# Patient Record
Sex: Female | Born: 1965
Health system: Southern US, Community
[De-identification: ages and names within clinical notes are randomized; demographics above are authoritative.]

## PROBLEM LIST (undated history)

## (undated) DIAGNOSIS — E559 Vitamin D deficiency, unspecified: Secondary | ICD-10-CM

## (undated) DIAGNOSIS — H8110 Benign paroxysmal vertigo, unspecified ear: Secondary | ICD-10-CM

## (undated) DIAGNOSIS — R002 Palpitations: Secondary | ICD-10-CM

## (undated) DIAGNOSIS — I499 Cardiac arrhythmia, unspecified: Secondary | ICD-10-CM

## (undated) DIAGNOSIS — Z9889 Other specified postprocedural states: Secondary | ICD-10-CM

## (undated) DIAGNOSIS — N951 Menopausal and female climacteric states: Secondary | ICD-10-CM

## (undated) DIAGNOSIS — E785 Hyperlipidemia, unspecified: Secondary | ICD-10-CM

## (undated) DIAGNOSIS — K219 Gastro-esophageal reflux disease without esophagitis: Secondary | ICD-10-CM

## (undated) DIAGNOSIS — R112 Nausea with vomiting, unspecified: Secondary | ICD-10-CM

## (undated) DIAGNOSIS — R7303 Prediabetes: Secondary | ICD-10-CM

## (undated) DIAGNOSIS — E0590 Thyrotoxicosis, unspecified without thyrotoxic crisis or storm: Secondary | ICD-10-CM

## (undated) DIAGNOSIS — F419 Anxiety disorder, unspecified: Secondary | ICD-10-CM

## (undated) DIAGNOSIS — F329 Major depressive disorder, single episode, unspecified: Secondary | ICD-10-CM

## (undated) HISTORY — DX: Menopausal and female climacteric states: N95.1

## (undated) HISTORY — DX: Palpitations: R00.2

## (undated) HISTORY — DX: Major depressive disorder, single episode, unspecified: F32.9

## (undated) HISTORY — DX: Other specified postprocedural states: Z98.890

## (undated) HISTORY — DX: Hyperlipidemia, unspecified: E78.5

## (undated) HISTORY — DX: Vitamin D deficiency, unspecified: E55.9

## (undated) HISTORY — DX: Anxiety disorder, unspecified: F41.9

## (undated) HISTORY — DX: Thyrotoxicosis, unspecified without thyrotoxic crisis or storm: E05.90

---

## 2004-06-27 ENCOUNTER — Ambulatory Visit: Payer: Self-pay | Admitting: Unknown Physician Specialty

## 2005-07-30 ENCOUNTER — Ambulatory Visit: Payer: Self-pay | Admitting: Unknown Physician Specialty

## 2006-08-03 ENCOUNTER — Ambulatory Visit: Payer: Self-pay | Admitting: Unknown Physician Specialty

## 2007-08-09 ENCOUNTER — Ambulatory Visit: Payer: Self-pay | Admitting: Unknown Physician Specialty

## 2007-08-25 ENCOUNTER — Ambulatory Visit: Payer: Self-pay | Admitting: Gastroenterology

## 2008-08-09 ENCOUNTER — Ambulatory Visit: Payer: Self-pay | Admitting: Unknown Physician Specialty

## 2009-08-13 ENCOUNTER — Ambulatory Visit: Payer: Self-pay | Admitting: Unknown Physician Specialty

## 2009-08-15 ENCOUNTER — Ambulatory Visit: Payer: Self-pay | Admitting: Unknown Physician Specialty

## 2010-08-21 ENCOUNTER — Ambulatory Visit: Payer: Self-pay | Admitting: Unknown Physician Specialty

## 2012-09-18 HISTORY — PX: COLONOSCOPY: SHX174

## 2012-09-26 ENCOUNTER — Ambulatory Visit: Payer: Self-pay | Admitting: Gastroenterology

## 2012-09-27 LAB — PATHOLOGY REPORT

## 2013-02-27 ENCOUNTER — Ambulatory Visit: Payer: Self-pay | Admitting: Physician Assistant

## 2013-02-27 LAB — RAPID STREP-A WITH REFLX: Micro Text Report: NEGATIVE

## 2013-03-02 LAB — BETA STREP CULTURE(ARMC)

## 2013-04-20 HISTORY — PX: TRIGGER FINGER RELEASE: SHX641

## 2013-09-14 DIAGNOSIS — F32A Depression, unspecified: Secondary | ICD-10-CM

## 2013-09-14 DIAGNOSIS — R946 Abnormal results of thyroid function studies: Secondary | ICD-10-CM

## 2013-09-14 DIAGNOSIS — F419 Anxiety disorder, unspecified: Secondary | ICD-10-CM

## 2013-09-14 DIAGNOSIS — E785 Hyperlipidemia, unspecified: Secondary | ICD-10-CM

## 2013-09-14 DIAGNOSIS — R002 Palpitations: Secondary | ICD-10-CM

## 2013-09-14 DIAGNOSIS — F329 Major depressive disorder, single episode, unspecified: Secondary | ICD-10-CM | POA: Insufficient documentation

## 2013-09-14 DIAGNOSIS — E039 Hypothyroidism, unspecified: Secondary | ICD-10-CM | POA: Insufficient documentation

## 2013-09-14 HISTORY — DX: Palpitations: R00.2

## 2013-09-14 HISTORY — DX: Hyperlipidemia, unspecified: E78.5

## 2013-09-14 HISTORY — DX: Depression, unspecified: F32.A

## 2013-09-14 HISTORY — DX: Anxiety disorder, unspecified: F41.9

## 2013-09-18 ENCOUNTER — Ambulatory Visit: Payer: Self-pay | Admitting: Internal Medicine

## 2013-10-27 DIAGNOSIS — Z9889 Other specified postprocedural states: Secondary | ICD-10-CM | POA: Insufficient documentation

## 2013-10-27 HISTORY — DX: Other specified postprocedural states: Z98.890

## 2013-12-21 ENCOUNTER — Ambulatory Visit: Payer: Self-pay | Admitting: Obstetrics and Gynecology

## 2014-05-24 ENCOUNTER — Ambulatory Visit: Payer: Self-pay | Admitting: Internal Medicine

## 2014-05-24 LAB — RAPID STREP-A WITH REFLX: MICRO TEXT REPORT: NEGATIVE

## 2014-05-27 LAB — BETA STREP CULTURE(ARMC)

## 2016-06-10 DIAGNOSIS — N951 Menopausal and female climacteric states: Secondary | ICD-10-CM | POA: Insufficient documentation

## 2016-06-10 HISTORY — DX: Menopausal and female climacteric states: N95.1

## 2016-06-11 ENCOUNTER — Other Ambulatory Visit: Payer: Self-pay | Admitting: Obstetrics & Gynecology

## 2016-06-11 DIAGNOSIS — Z1231 Encounter for screening mammogram for malignant neoplasm of breast: Secondary | ICD-10-CM

## 2016-06-23 ENCOUNTER — Encounter: Payer: Self-pay | Admitting: Radiology

## 2016-06-23 ENCOUNTER — Ambulatory Visit
Admission: RE | Admit: 2016-06-23 | Discharge: 2016-06-23 | Disposition: A | Discharge status: Home / Self Care | Payer: BLUE CROSS/BLUE SHIELD | Source: Ambulatory Visit | Attending: Obstetrics & Gynecology | Admitting: Obstetrics & Gynecology

## 2016-06-23 DIAGNOSIS — Z1231 Encounter for screening mammogram for malignant neoplasm of breast: Secondary | ICD-10-CM

## 2016-07-30 ENCOUNTER — Ambulatory Visit (INDEPENDENT_AMBULATORY_CARE_PROVIDER_SITE_OTHER): Payer: BLUE CROSS/BLUE SHIELD | Admitting: Gastroenterology

## 2016-07-30 ENCOUNTER — Encounter: Payer: Self-pay | Admitting: Gastroenterology

## 2016-07-30 ENCOUNTER — Other Ambulatory Visit: Payer: Self-pay

## 2016-07-30 VITALS — BP 129/74 | HR 76 | Temp 98.4°F | Ht 64.0 in | Wt 174.0 lb

## 2016-07-30 DIAGNOSIS — R110 Nausea: Secondary | ICD-10-CM

## 2016-07-30 DIAGNOSIS — F32A Depression, unspecified: Secondary | ICD-10-CM | POA: Insufficient documentation

## 2016-07-30 DIAGNOSIS — R194 Change in bowel habit: Secondary | ICD-10-CM

## 2016-07-30 DIAGNOSIS — R197 Diarrhea, unspecified: Secondary | ICD-10-CM

## 2016-07-30 DIAGNOSIS — F329 Major depressive disorder, single episode, unspecified: Secondary | ICD-10-CM | POA: Insufficient documentation

## 2016-07-30 HISTORY — DX: Depression, unspecified: F32.A

## 2016-07-30 NOTE — Progress Notes (Signed)
Gastroenterology Consultation  Referring Provider:     Idelle Crouch, MD Primary Care Physician:  Idelle Crouch, MD Primary Gastroenterologist:  Dr. Allen Norris     Reason for Consultation:     Diarrhea        HPI:   Olivia Armstrong is a 51 y.o. y/o female referred for consultation & management of Diarrhea by Dr. Fulton Reek D, MD.  This patient has seen me in the past for a colonoscopy approximately 4 years ago.  The patient now comes in with 4 months of loose stools.  The patient states her loose stools are usually in the morning and then typically firm up later in the day.  She also reports that she has a lot of bloating but does not report passing any gas.  She states that she also has accompanying nausea when she has this.  There is some intermittent right-sided pain but it is not associated with nausea or fatty greasy foods.  The patient also states that the abdominal pain is rare compared to the diarrhea she has been having for the last 4 months.  She denies any unexplained weight loss with this diarrhea.  She also denies that the diarrhea wakes her up in the middle of the night.  The patient has been reporting that she feels bloated usually after she has a bowel movement.  She denies any episodes similar to this in the past.  Past Medical History:  Diagnosis Date  . Anxiety and depression 09/14/2013  . Depression 07/30/2016  . Hyperlipidemia, unspecified 09/14/2013  . Hyperthyroidism   . Menopausal symptoms 06/10/2016   Overview:  prometrium 200mg  daily estrdiol patch 0.05mg /day q3days  . Palpitations 09/14/2013  . S/P trigger finger release 10/27/2013  . Vitamin D deficiency     No past surgical history on file.  Prior to Admission medications   Medication Sig Start Date End Date Taking? Authorizing Provider  B Complex Vitamins (VITAMIN B COMPLEX Armstrong) Take by mouth.   Yes Historical Provider, MD  escitalopram (LEXAPRO) 10 MG tablet TAKE 1 TABLET (10 MG TOTAL) BY MOUTH ONCE  DAILY. 09/02/15  Yes Historical Provider, MD  estradiol (VIVELLE-DOT) 0.05 MG/24HR patch APPLY 1 PATCH ONTO SKIN TWICE A WEEK 03/20/16  Yes Historical Provider, MD  Multiple Vitamin (MULTI-VITAMINS) TABS Take by mouth.   Yes Historical Provider, MD  Probiotic Product ([employer] PROBIOTIC MAXIMUM STRENGTH) CAPS Take by mouth.   Yes Historical Provider, MD  progesterone (PROMETRIUM) 200 MG capsule TAKE 1 CAPSULE(S) EVERY DAY BY ORAL ROUTE FOR 90 DAYS. 03/20/16  Yes Historical Provider, MD  thyroid (ARMOUR THYROID) 30 MG tablet Take by mouth. 10/04/15  Yes Historical Provider, MD  Vitamin D, Ergocalciferol, (DRISDOL) 50000 units CAPS capsule Take by mouth. 05/18/16  Yes Historical Provider, MD  zolpidem (AMBIEN) 5 MG tablet Take by mouth. 05/21/16  Yes Historical Provider, MD    Family History  Problem Relation Age of Onset  . Breast cancer Maternal Grandmother 90    mat great gm  . Colon cancer Sister      Social History  Substance Use Topics  . Smoking status: Never Smoker  . Smokeless tobacco: Never Used  . Alcohol use Yes    Allergies as of 07/30/2016  . (No Known Allergies)    Review of Systems:    All systems reviewed and negative except where noted in HPI.   Physical Exam:  BP 129/74   Pulse 76   Temp 98.4 F (36.9 C) (Oral)  Ht 5\' 4"  (1.626 m)   Wt 174 lb (78.9 kg)   BMI 29.87 kg/m  No LMP recorded. Patient is postmenopausal. Psych:  Alert and cooperative. Normal mood and affect. General:   Alert,  Well-developed, well-nourished, pleasant and cooperative in NAD Head:  Normocephalic and atraumatic. Eyes:  Sclera clear, no icterus.   Conjunctiva pink. Ears:  Normal auditory acuity. Nose:  No deformity, discharge, or lesions. Mouth:  No deformity or lesions,oropharynx pink & moist. Neck:  Supple; no masses or thyromegaly. Lungs:  Respirations even and unlabored.  Clear throughout to auscultation.   No wheezes, crackles, or rhonchi. No acute distress. Heart:  Regular rate and  rhythm; no murmurs, clicks, rubs, or gallops. Abdomen:  Normal bowel sounds.  No bruits.  Soft, non-tender and non-distended without masses, hepatosplenomegaly or hernias noted.  No guarding or rebound tenderness.  Negative Carnett sign.   Rectal:  Deferred.  Msk:  Symmetrical without gross deformities.  Good, equal movement & strength bilaterally. Pulses:  Normal pulses noted. Extremities:  No clubbing or edema.  No cyanosis. Neurologic:  Alert and oriented x3;  grossly normal neurologically. Skin:  Intact without significant lesions or rashes.  No jaundice. Lymph Nodes:  No significant cervical adenopathy. Psych:  Alert and cooperative. Normal mood and affect.  Imaging Studies: No results found.  Assessment and Plan:   Olivia Armstrong is a 51 y.o. y/o female Who comes in today with diarrhea that is usually occurring in the morning.  The patient states that she partakes in dairy intake every day.  She also denies any symptoms of inflammatory bowel disease such as rectal bleeding weight loss or abdominal pain associated with the diarrhea.  She also has bloating which could be a sign of irritable bowel syndrome also.  The patient has been told to avoid milk products for one week to see if her stools become more firm.  The patient denies any heartburn or vomiting but does have some intermittent nausea.  She has been told that if her nausea does not improve with her diarrhea when she stops milk she should contact our office for the next step.  The patient has been explained the plan and agrees with it.    Lucilla Lame, MD. Marval Regal   Note: This dictation was prepared with Dragon dictation along with smaller phrase technology. Any transcriptional errors that result from this process are unintentional.

## 2016-11-24 ENCOUNTER — Other Ambulatory Visit: Payer: Self-pay | Admitting: Internal Medicine

## 2016-11-24 DIAGNOSIS — R1310 Dysphagia, unspecified: Secondary | ICD-10-CM

## 2016-11-24 DIAGNOSIS — R634 Abnormal weight loss: Secondary | ICD-10-CM

## 2016-11-24 DIAGNOSIS — R197 Diarrhea, unspecified: Secondary | ICD-10-CM

## 2016-12-01 ENCOUNTER — Ambulatory Visit: Admission: RE | Admit: 2016-12-01 | Payer: BLUE CROSS/BLUE SHIELD | Source: Ambulatory Visit

## 2016-12-02 ENCOUNTER — Other Ambulatory Visit: Payer: Self-pay | Admitting: Internal Medicine

## 2016-12-02 ENCOUNTER — Ambulatory Visit
Admission: RE | Admit: 2016-12-02 | Discharge: 2016-12-02 | Disposition: A | Discharge status: Home / Self Care | Payer: BLUE CROSS/BLUE SHIELD | Source: Ambulatory Visit | Attending: Internal Medicine | Admitting: Internal Medicine

## 2016-12-02 DIAGNOSIS — K449 Diaphragmatic hernia without obstruction or gangrene: Secondary | ICD-10-CM | POA: Diagnosis not present

## 2016-12-02 DIAGNOSIS — R1310 Dysphagia, unspecified: Secondary | ICD-10-CM

## 2016-12-02 DIAGNOSIS — R197 Diarrhea, unspecified: Secondary | ICD-10-CM

## 2016-12-02 DIAGNOSIS — R634 Abnormal weight loss: Secondary | ICD-10-CM

## 2016-12-07 ENCOUNTER — Ambulatory Visit
Admission: RE | Admit: 2016-12-07 | Discharge: 2016-12-07 | Disposition: A | Discharge status: Home / Self Care | Payer: BLUE CROSS/BLUE SHIELD | Source: Ambulatory Visit | Attending: Internal Medicine | Admitting: Internal Medicine

## 2016-12-07 DIAGNOSIS — R634 Abnormal weight loss: Secondary | ICD-10-CM

## 2016-12-07 DIAGNOSIS — R197 Diarrhea, unspecified: Secondary | ICD-10-CM

## 2016-12-07 MED ORDER — IOPAMIDOL (ISOVUE-300) INJECTION 61%
100.0000 mL | Freq: Once | INTRAVENOUS | Status: AC | PRN
  Administered 2016-12-07: 100 mL via INTRAVENOUS

## 2017-01-22 ENCOUNTER — Other Ambulatory Visit: Payer: BLUE CROSS/BLUE SHIELD

## 2017-01-26 ENCOUNTER — Encounter
Admission: RE | Admit: 2017-01-26 | Discharge: 2017-01-26 | Disposition: A | Discharge status: Home / Self Care | Payer: BLUE CROSS/BLUE SHIELD | Source: Ambulatory Visit | Attending: Obstetrics & Gynecology | Admitting: Obstetrics & Gynecology

## 2017-01-26 DIAGNOSIS — R9389 Abnormal findings on diagnostic imaging of other specified body structures: Secondary | ICD-10-CM | POA: Insufficient documentation

## 2017-01-26 DIAGNOSIS — Z01818 Encounter for other preprocedural examination: Secondary | ICD-10-CM | POA: Diagnosis not present

## 2017-01-26 DIAGNOSIS — R000 Tachycardia, unspecified: Secondary | ICD-10-CM | POA: Diagnosis not present

## 2017-01-26 HISTORY — DX: Gastro-esophageal reflux disease without esophagitis: K21.9

## 2017-01-26 HISTORY — DX: Cardiac arrhythmia, unspecified: I49.9

## 2017-01-26 LAB — TYPE AND SCREEN
ABO/RH(D): A POS
Antibody Screen: NEGATIVE

## 2017-01-26 LAB — CBC
HCT: 40.1 % (ref 35.0–47.0)
Hemoglobin: 14.1 g/dL (ref 12.0–16.0)
MCH: 31.6 pg (ref 26.0–34.0)
MCHC: 35.3 g/dL (ref 32.0–36.0)
MCV: 89.5 fL (ref 80.0–100.0)
PLATELETS: 235 10*3/uL (ref 150–440)
RBC: 4.48 MIL/uL (ref 3.80–5.20)
RDW: 13.5 % (ref 11.5–14.5)
WBC: 7.7 10*3/uL (ref 3.6–11.0)

## 2017-01-26 LAB — BASIC METABOLIC PANEL
ANION GAP: 8 (ref 5–15)
BUN: 17 mg/dL (ref 6–20)
CALCIUM: 9 mg/dL (ref 8.9–10.3)
CO2: 25 mmol/L (ref 22–32)
Chloride: 104 mmol/L (ref 101–111)
Creatinine, Ser: 0.73 mg/dL (ref 0.44–1.00)
GLUCOSE: 98 mg/dL (ref 65–99)
POTASSIUM: 3.9 mmol/L (ref 3.5–5.1)
SODIUM: 137 mmol/L (ref 135–145)

## 2017-01-26 NOTE — H&P (Signed)
Chief Complaint:    Patient ID: Olivia Armstrong is a 51 y.o. female presenting with Pre Op Consulting  on 01/26/2017  HPI: Kenedi returns today to sign consents for a D&C, Hysteroscopy due to persistent irregular bleeding and increasing thickening of her endometrium.   Workup:  Ultrasound showed a 6x3x3cm vascular uterus, retroverted/flexed and with an EE of 33mm - complex and vascular.  Cervix has nabothian cysts with an echogenic feature  LO: 2x1x1cm  RO: 2x1x1cm  She had an ultrasound in 2015 that showed similar features of the cervix, and an EE of 78mm  She also had an EMB then that showed disordered proliferative phase endometrium. No hyperplasia or carcinoma.  Pap: 05/2016 neg/neg   Past Medical History:  has a past medical history of Anxiety, unspecified; Borderline abnormal thyroid function test; CAD (coronary artery disease); Colon cancer (CMS-HCC); Depression, unspecified; History of shortness of breath; HPV (human papilloma virus) anogenital infection; Hyperlipidemia, unspecified; Hypothyroidism, unspecified; Palpitations; and Tachycardia, unspecified.  Past Surgical History:  has a past surgical history that includes trigger thumb (2015). Family History: family history includes Colon cancer in her sister; Heart disease in her father; Myocardial Infarction (Heart attack) in her father and paternal grandfather; Thyroid disease in her mother. Social History:  reports that she quit smoking about 32 years ago. She quit after 5.00 years of use. She has never used smokeless tobacco. She reports that she drinks about 0.6 oz of alcohol per week . She reports that she does not use drugs. OB/GYN History:  OB History    Gravida Para Term Preterm AB Living   0 0 0 0 0 0   SAB TAB Ectopic Molar Multiple Live Births   0 0 0 0 0 0      Allergies: has No Known Allergies. Medications:  Current Outpatient Prescriptions:  .  ASCORBATE CALCIUM/BIOFLAVONOID (ESTER-C ORAL), Take by mouth.,  Disp: , Rfl:  .  b complex vitamins capsule, Take 2 capsules by mouth once daily., Disp: , Rfl:  .  ergocalciferol, vitamin D2, 50,000 unit capsule, TAKE 1 CAPSULE (50,000 UNITS TOTAL) BY MOUTH TWICE A WEEK., Disp: 8 capsule, Rfl: 11 .  escitalopram oxalate (LEXAPRO) 10 MG tablet, TAKE 1 TABLET (10 MG TOTAL) BY MOUTH ONCE DAILY., Disp: 90 tablet, Rfl: 3 .  estradiol (VIVELLE-DOT) patch 0.05 mg/24 hr, APPLY 1 PATCH ONTO SKIN TWICE A WEEK, Disp: , Rfl: 3 .  Herbal Supplement, Herbal Name: Adaptocrine, Disp: , Rfl:  .  hydrocortisone 2.5 % cream, Apply topically 2 (two) times daily., Disp: 30 g, Rfl: 1 .  lactobacillus comb no.10 (PROBIOTIC) 20 billion cell Cap, Take by mouth., Disp: , Rfl:  .  multivitamin tablet, Take 1 tablet by mouth once daily., Disp: , Rfl:  .  omeprazole (PRILOSEC) 40 MG DR capsule, Take 1 capsule (40 mg total) by mouth once daily., Disp: 30 capsule, Rfl: 11 .  progesterone (PROMETRIUM) 200 MG capsule, TAKE 1 CAPSULE(S) EVERY DAY BY ORAL ROUTE FOR 90 DAYS., Disp: , Rfl: 3 .  ranitidine (ZANTAC) 300 MG tablet, Take 1 tablet (300 mg total) by mouth nightly., Disp: 30 tablet, Rfl: 11 .  thyroid (ARMOUR THYROID) 30 mg tablet, Take 1 tablet (30 mg total) by mouth every morning before breakfast (0630)., Disp: 90 tablet, Rfl: 3 .  zolpidem (AMBIEN) 5 MG tablet, TAKE 1 TABLET BY MOUTH NIGHTLY, Disp: 30 tablet, Rfl: 3   Review of Systems: No SOB, no palpitations or chest pain, no new lower extremity edema,  no nausea or vomiting or bowel or bladder complaints. See HPI for gyn specific ROS.   Exam:     BP 130/84   Pulse 83   Ht 162.6 cm (5\' 4" )   Wt 73.7 kg (162 lb 6.4 oz)   BMI 27.88 kg/m   General: Patient is well-groomed, well-nourished, appears stated age in no acute distress  HEENT: head is atraumatic and normocephalic, trachea is midline, neck is supple with no palpable nodules  CV: Regular rhythm and normal heart rate, no murmur  Pulm: Clear to auscultation  throughout lung fields with no wheezing, crackles, or rhonchi. No increased work of breathing  Abdomen: soft , no mass, non-tender, no rebound tenderness, no hepatomegaly    Impression:   The encounter diagnosis was Thickened endometrium.    Plan:   Planned procedure:  Dilation and curettage, hysteroscopy.   The patient and I discussed the technical aspects of the procedure including the potential for risks and complications.These include but are not limited to the risk of infection requiring post-operative antibiotics or further procedures.We talked about the risk of injury to adjacent organs including bladder, bowel, ureter, blood vessels or nerves, the need to convert to a laparoscopic or open incision, possibleneed for blood transfusion andpostop complications such asthromboembolic or cardiopulmonary complications.All of her questions were answered. Her preoperative exam was completed andthe appropriate consents were signed. She is scheduled to undergo this procedure on 02-05-17  I personally performed the service.  (TP)  Gloria Ricardo CRIST Haston Casebolt, MD

## 2017-01-26 NOTE — Patient Instructions (Addendum)
Your procedure is scheduled on: February 05, 2017  Report to [facility] in the [place]  To find out your arrival time please call [phone] between [place] on February 04, 2017   Remember: Instructions that are not followed completely may result in serious medical risk, up to and including death, or upon the discretion of your surgeon and anesthesiologist your surgery may need to be rescheduled.     _X__ 1. Do not eat food after midnight the night before your procedure.                 No gum chewing or hard candies. You may drink clear liquids up to 2 hours                 before you are scheduled to arrive for your surgery- DO not drink clear                 liquids within 2 hours of the start of your surgery.                 Clear Liquids include:  water, apple juice without pulp, clear carbohydrate                 drink such as Clearfast of [place]art[place], Black Coffee or Tea (Do not add                 anything to coffee or tea).     _X__ 2.  No Alcohol for 24 hours before or after surgery.   _X__ 3.  Do Not Smoke or use e-cigarettes For 24 Hours Prior to Your Surgery.                 Do not use any chewable tobacco products for at least 6 hours prior to                 surgery.  ____  4.  Bring all medications with you on the day of surgery if instructed.   _X___  5.  Notify your doctor if there is any change in your medical condition      (cold, fever, infections).     Do not wear jewelry, make-up, hairpins, clips or nail polish. Do not wear lotions, powders, or perfumes. You may wear deodorant. Do not shave 48 hours prior to surgery. Men may shave face and neck. Do not bring valuables to the hospital.    [facility] is not responsible for any belongings or valuables.  Contacts, dentures or bridgework may not be worn into surgery. Leave your suitcase in the car. After surgery it may be brought to your room. For patients admitted  to the hospital, discharge time is determined by your treatment team.   Patients discharged the day of surgery will not be allowed to drive home.   Please read over the following fact sheets that you were given:   [place]   ____ Take these medicines the morning of surgery with A SIP OF WATER:    1. Prilosec morning of surgery.  Ranitidine night before surgery  2. Progesterone  3. Armour Thyroid  4.   5.  6.  ____ Fleet Enema (as directed)   __x__ Use CHG Soap as directed.  Shower night before and morning of surgery with an antibacterial soap.  ____ Use inhalers on the day of surgery  ____ Stop metformin 2 days prior to surgery  ____ Take 1/2 of usual insulin dose the night before surgery. No insulin the morning          of surgery.   ____ Stop Coumadin/Plavix/aspirin on 01/26/2017  ____ Stop Anti-inflammatories (such as ibuprofen/motrin/aleve) as of 01/29/2017   __x__ Stop supplements until after surgery.  (As discussed in preop meeting)  ____ Bring C-Pap to the hospital.

## 2017-02-05 ENCOUNTER — Encounter: Payer: Self-pay | Admitting: *Deleted

## 2017-02-05 ENCOUNTER — Ambulatory Visit: Payer: BLUE CROSS/BLUE SHIELD | Admitting: Registered Nurse

## 2017-02-05 ENCOUNTER — Ambulatory Visit
Admission: RE | Admit: 2017-02-05 | Discharge: 2017-02-05 | Disposition: A | Discharge status: Home / Self Care | Payer: BLUE CROSS/BLUE SHIELD | Source: Ambulatory Visit | Attending: Obstetrics & Gynecology | Admitting: Obstetrics & Gynecology

## 2017-02-05 ENCOUNTER — Encounter
Admission: RE | Disposition: A | Discharge status: Home / Self Care | Payer: Self-pay | Source: Ambulatory Visit | Attending: Obstetrics & Gynecology

## 2017-02-05 DIAGNOSIS — I2510 Atherosclerotic heart disease of native coronary artery without angina pectoris: Secondary | ICD-10-CM | POA: Insufficient documentation

## 2017-02-05 DIAGNOSIS — E039 Hypothyroidism, unspecified: Secondary | ICD-10-CM | POA: Insufficient documentation

## 2017-02-05 DIAGNOSIS — F329 Major depressive disorder, single episode, unspecified: Secondary | ICD-10-CM | POA: Diagnosis not present

## 2017-02-05 DIAGNOSIS — E785 Hyperlipidemia, unspecified: Secondary | ICD-10-CM | POA: Insufficient documentation

## 2017-02-05 DIAGNOSIS — F419 Anxiety disorder, unspecified: Secondary | ICD-10-CM | POA: Insufficient documentation

## 2017-02-05 DIAGNOSIS — R9389 Abnormal findings on diagnostic imaging of other specified body structures: Secondary | ICD-10-CM | POA: Diagnosis not present

## 2017-02-05 DIAGNOSIS — Z85038 Personal history of other malignant neoplasm of large intestine: Secondary | ICD-10-CM | POA: Diagnosis not present

## 2017-02-05 DIAGNOSIS — Z79899 Other long term (current) drug therapy: Secondary | ICD-10-CM | POA: Diagnosis not present

## 2017-02-05 DIAGNOSIS — N858 Other specified noninflammatory disorders of uterus: Secondary | ICD-10-CM | POA: Diagnosis not present

## 2017-02-05 DIAGNOSIS — N939 Abnormal uterine and vaginal bleeding, unspecified: Secondary | ICD-10-CM | POA: Diagnosis not present

## 2017-02-05 DIAGNOSIS — Z87891 Personal history of nicotine dependence: Secondary | ICD-10-CM | POA: Insufficient documentation

## 2017-02-05 HISTORY — PX: HYSTEROSCOPY W/D&C: SHX1775

## 2017-02-05 LAB — ABO/RH: ABO/RH(D): A POS

## 2017-02-05 LAB — POCT PREGNANCY, URINE: Preg Test, Ur: NEGATIVE

## 2017-02-05 SURGERY — DILATATION AND CURETTAGE /HYSTEROSCOPY
Anesthesia: General | Site: Vagina | Wound class: Clean Contaminated

## 2017-02-05 MED ORDER — PROMETHAZINE HCL 25 MG/ML IJ SOLN
12.5000 mg | Freq: Once | INTRAMUSCULAR | Status: AC
  Administered 2017-02-05: 12.5 mg via INTRAVENOUS

## 2017-02-05 MED ORDER — DEXAMETHASONE SODIUM PHOSPHATE 10 MG/ML IJ SOLN
INTRAMUSCULAR | Status: AC
  Filled 2017-02-05: qty 1

## 2017-02-05 MED ORDER — MORPHINE SULFATE (PF) 4 MG/ML IV SOLN: 1.0000 mg | INTRAVENOUS | Status: DC | PRN

## 2017-02-05 MED ORDER — ACETAMINOPHEN 325 MG PO TABS
650.0000 mg | ORAL_TABLET | ORAL | Status: DC | PRN
Start: 2017-02-05 — End: 2017-02-05

## 2017-02-05 MED ORDER — MIDAZOLAM HCL 2 MG/2ML IJ SOLN
INTRAMUSCULAR | Status: AC
  Filled 2017-02-05: qty 2

## 2017-02-05 MED ORDER — ONDANSETRON HCL 4 MG/2ML IJ SOLN
INTRAMUSCULAR | Status: DC | PRN
  Administered 2017-02-05: 4 mg via INTRAVENOUS

## 2017-02-05 MED ORDER — PROPOFOL 10 MG/ML IV BOLUS
INTRAVENOUS | Status: AC
  Filled 2017-02-05: qty 20

## 2017-02-05 MED ORDER — FENTANYL CITRATE (PF) 100 MCG/2ML IJ SOLN
INTRAMUSCULAR | Status: AC
  Filled 2017-02-05: qty 2

## 2017-02-05 MED ORDER — OXYCODONE HCL 5 MG PO TABS: 5.0000 mg | ORAL_TABLET | Freq: Once | ORAL | Status: DC | PRN

## 2017-02-05 MED ORDER — ACETAMINOPHEN 650 MG RE SUPP
650.0000 mg | RECTAL | Status: DC | PRN
  Filled 2017-02-05: qty 1

## 2017-02-05 MED ORDER — KETOROLAC TROMETHAMINE 30 MG/ML IJ SOLN
30.0000 mg | Freq: Four times a day (QID) | INTRAMUSCULAR | Status: DC
  Administered 2017-02-05: 30 mg via INTRAVENOUS
  Filled 2017-02-05: qty 1

## 2017-02-05 MED ORDER — LACTATED RINGERS IV SOLN
INTRAVENOUS | Status: DC
  Administered 2017-02-05: 10:00:00 via INTRAVENOUS

## 2017-02-05 MED ORDER — EPHEDRINE SULFATE 50 MG/ML IJ SOLN
INTRAMUSCULAR | Status: DC | PRN
  Administered 2017-02-05 (×2): 10 mg via INTRAVENOUS

## 2017-02-05 MED ORDER — FENTANYL CITRATE (PF) 100 MCG/2ML IJ SOLN
25.0000 ug | INTRAMUSCULAR | Status: DC | PRN
  Administered 2017-02-05: 25 ug via INTRAVENOUS

## 2017-02-05 MED ORDER — PROMETHAZINE HCL 25 MG/ML IJ SOLN
INTRAMUSCULAR | Status: AC
  Administered 2017-02-05: 12.5 mg via INTRAVENOUS
  Filled 2017-02-05: qty 1

## 2017-02-05 MED ORDER — SUCCINYLCHOLINE CHLORIDE 20 MG/ML IJ SOLN
INTRAMUSCULAR | Status: DC | PRN
  Administered 2017-02-05: 20 mg via INTRAVENOUS

## 2017-02-05 MED ORDER — ONDANSETRON HCL 4 MG/2ML IJ SOLN
INTRAMUSCULAR | Status: AC
  Filled 2017-02-05: qty 2

## 2017-02-05 MED ORDER — PROPOFOL 10 MG/ML IV BOLUS
INTRAVENOUS | Status: DC | PRN
  Administered 2017-02-05 (×2): 50 mg via INTRAVENOUS
  Administered 2017-02-05: 150 mg via INTRAVENOUS

## 2017-02-05 MED ORDER — MIDAZOLAM HCL 2 MG/2ML IJ SOLN
INTRAMUSCULAR | Status: DC | PRN
  Administered 2017-02-05: 2 mg via INTRAVENOUS

## 2017-02-05 MED ORDER — LIDOCAINE HCL (CARDIAC) 20 MG/ML IV SOLN
INTRAVENOUS | Status: DC | PRN
Start: 2017-02-05 — End: 2017-02-05
  Administered 2017-02-05: 100 mg via INTRAVENOUS

## 2017-02-05 MED ORDER — FENTANYL CITRATE (PF) 100 MCG/2ML IJ SOLN
INTRAMUSCULAR | Status: DC | PRN
  Administered 2017-02-05 (×2): 50 ug via INTRAVENOUS

## 2017-02-05 MED ORDER — DEXAMETHASONE SODIUM PHOSPHATE 10 MG/ML IJ SOLN
INTRAMUSCULAR | Status: DC | PRN
Start: 2017-02-05 — End: 2017-02-05
  Administered 2017-02-05: 10 mg via INTRAVENOUS

## 2017-02-05 MED ORDER — OXYCODONE HCL 5 MG/5ML PO SOLN: 5.0000 mg | Freq: Once | ORAL | Status: DC | PRN

## 2017-02-05 MED ORDER — LIDOCAINE HCL (PF) 2 % IJ SOLN
INTRAMUSCULAR | Status: AC
  Filled 2017-02-05: qty 10

## 2017-02-05 MED ORDER — SODIUM CHLORIDE FLUSH 0.9 % IV SOLN
INTRAVENOUS | Status: AC
  Filled 2017-02-05: qty 10

## 2017-02-05 SURGICAL SUPPLY — 17 items
BAG INFUSER PRESSURE 100CC (MISCELLANEOUS) ×3 IMPLANT
ELECT REM PT RETURN 9FT ADLT (ELECTROSURGICAL) ×3
ELECTRODE REM PT RTRN 9FT ADLT (ELECTROSURGICAL) ×1 IMPLANT
GLOVE PI ORTHOPRO 6.5 (GLOVE) ×2
GLOVE PI ORTHOPRO STRL 6.5 (GLOVE) ×1 IMPLANT
GLOVE SURG SYN 6.5 ES PF (GLOVE) ×3 IMPLANT
GOWN STRL REUS W/ TWL LRG LVL3 (GOWN DISPOSABLE) ×2 IMPLANT
GOWN STRL REUS W/TWL LRG LVL3 (GOWN DISPOSABLE) ×4
IV LACTATED RINGERS 1000ML (IV SOLUTION) ×3 IMPLANT
KIT RM TURNOVER CYSTO AR (KITS) ×3 IMPLANT
NEEDLE SPNL 22GX3.5 QUINCKE BK (NEEDLE) ×3 IMPLANT
PACK DNC HYST (MISCELLANEOUS) ×3 IMPLANT
PAD OB MATERNITY 4.3X12.25 (PERSONAL CARE ITEMS) ×3 IMPLANT
PAD PREP 24X41 OB/GYN DISP (PERSONAL CARE ITEMS) ×3 IMPLANT
SYRINGE 10CC LL (SYRINGE) ×3 IMPLANT
TUBING CONNECTING 10 (TUBING) ×2 IMPLANT
TUBING CONNECTING 10' (TUBING) ×1

## 2017-02-05 NOTE — Op Note (Signed)
Operative Report Hysteroscopy, Dilation and Curettage 02/05/2017  Patient:  Olivia Armstrong  51 y.o. female Preoperative diagnosis:  Thickened Endometrium, abnormal uterine bleeding Postoperative diagnosis:  Thickened Endometrium, abnormal uterine bleeding  PROCEDURE:  Procedure(s): DILATATION AND CURETTAGE /HYSTEROSCOPY (N/A) Surgeon:  Surgeon(s) and Role:    * Marquel Pottenger, Honor Loh, MD - Primary Anesthesia:  LMA I/O: Total I/O In: 600 [I.V.:600] Out: - minimal, no UOP Specimens:  Endometrial curettings Complications: None Apparent Disposition:  VS stable to PACU  Findings: Uterus, mobile, normal size, sounding to 8 cm; normal cervix, vagina, perineum.  Hysteroscopic findings:  Long cervix, no pedunculated polyp, polypoid tissue anteriorly.  Indication for procedure/Consents: 51 y.o. here for scheduled surgery for the aforementioned diagnoses.  Risks of surgery were discussed with the patient including but not limited to: bleeding which may require transfusion; infection which may require antibiotics; injury to uterus or surrounding organs; intrauterine scarring which may impair future fertility; need for additional procedures including laparotomy or laparoscopy; and other postoperative/anesthesia complications. Written informed consent was obtained.    Procedure Details:   The patient was then taken to the operating room where anesthesia was administered and was found to be adequate.  After a formal and adequate timeout was performed, she was placed in the dorsal lithotomy position and examined with the above findings. She was then prepped and draped in the sterile manner.  A speculum was then placed in the patient's vagina and a single tooth tenaculum was applied to the anterior lip of the cervix.   The uterus was sounded to 8cm. Her cervix was serially dilated to accommodate the hysteroscope,  with findings as above. A sharp curettage was then performed until there was a gritty texture  in all four quadrants. The specimen was handed off to nursing.  The camera was reinserted and confirmed the uterus had been evacuated. The tenaculum was removed from the anterior lip of the cervix and the vaginal speculum was removed after noting good hemostasis. The patient tolerated the procedure well and was taken to the recovery area awake, extubated and in stable condition.  The patient will be discharged to home as per PACU criteria.  Routine postoperative instructions given. She will follow up in the clinic in two to four weeks for postoperative evaluation.  Larey Days, MD [facility] OBGYN Attending Gynecologist

## 2017-02-05 NOTE — Interval H&P Note (Signed)
History and Physical Interval Note:  02/05/2017 10:18 AM  Olivia Armstrong  has presented today for surgery, with the diagnosis of Thickened Endometrium  The various methods of treatment have been discussed with the patient and family. After consideration of risks, benefits and other options for treatment, the patient has consented to  Procedure(s): DILATATION AND CURETTAGE /HYSTEROSCOPY (N/A) as a surgical intervention .  The patient's history has been reviewed, patient examined, no change in status, stable for surgery.  I have reviewed the patient's chart and labs.  Questions were answered to the patient's satisfaction.     [place]

## 2017-02-05 NOTE — Discharge Instructions (Signed)
You should expect to have some cramping and vaginal bleeding for about a week. This should taper off and subside, much like a period. If heavy bleeding continues or gets worse, you should contact the office for an earlier appointment.   Please call the office or physician on call for fever >101, severe pain, and heavy bleeding.   [place]!!  AMBULATORY SURGERY  DISCHARGE INSTRUCTIONS   1) The drugs that you were given will stay in your system until tomorrow so for the next 24 hours you should not:  A) Drive an automobile B) Make any legal decisions C) Drink any alcoholic beverage   2) You may resume regular meals tomorrow.  Today it is better to start with liquids and gradually work up to solid foods.  You may eat anything you prefer, but it is better to start with liquids, then soup and crackers, and gradually work up to solid foods.   3) Please notify your doctor immediately if you have any unusual bleeding, trouble breathing, redness and pain at the surgery site, drainage, fever, or pain not relieved by medication.    4) Additional Instructions:        Please contact your physician with any problems or Same Day Surgery at [phone], Monday through Friday 6 am to 4 pm, or [place] at [facility] number at [phone].

## 2017-02-05 NOTE — Anesthesia Post-op Follow-up Note (Signed)
Anesthesia QCDR form completed.        

## 2017-02-05 NOTE — Transfer of Care (Signed)
Immediate Anesthesia Transfer of Care Note  Patient: Olivia Armstrong  Procedure(s) Performed: Procedure(s): DILATATION AND CURETTAGE /HYSTEROSCOPY (N/A)  Patient Location: PACU  Anesthesia Type:General  Level of Consciousness: sedated  Airway & Oxygen Therapy: Patient Spontanous Breathing and Patient connected to face mask oxygen  Post-op Assessment: Report given to RN and Post -op Vital signs reviewed and stable  Post vital signs: Reviewed and stable  Last Vitals:  Vitals:   02/05/17 0927 02/05/17 1152  BP: 123/88 119/65  Pulse: 66 (!) 102  Resp: 16 16  Temp: 37.2 C 36.4 C  SpO2: 62% 694%    Complications: No apparent anesthesia complications

## 2017-02-05 NOTE — Anesthesia Preprocedure Evaluation (Signed)
Anesthesia Evaluation  Patient identified by MRN, date of birth, ID band Patient awake    Reviewed: Allergy & Precautions, H&P , NPO status , Patient's Chart, lab work & pertinent test results  History of Anesthesia Complications Negative for: history of anesthetic complications  Airway Mallampati: III  TM Distance: <3 FB Neck ROM: full    Dental  (+) Chipped   Pulmonary neg pulmonary ROS, neg shortness of breath,           Cardiovascular Exercise Tolerance: Good (-) angina(-) Past MI and (-) DOE + dysrhythmias      Neuro/Psych PSYCHIATRIC DISORDERS Anxiety Depression negative neurological ROS     GI/Hepatic Neg liver ROS, GERD  ,  Endo/Other  Hypothyroidism Hyperthyroidism   Renal/GU      Musculoskeletal   Abdominal   Peds  Hematology negative hematology ROS (+)   Anesthesia Other Findings Past Medical History: 09/14/2013: Anxiety and depression 07/30/2016: Depression No date: Dysrhythmia     Comment:  palpitations.  no treatment. saw dr. Marcelline Deist and had               stress test but no diagnosis No date: GERD (gastroesophageal reflux disease)     Comment:  winding down on antacids 09/14/2013: Hyperlipidemia, unspecified No date: Hyperthyroidism 06/10/2016: Menopausal symptoms     Comment:  Overview:  prometrium 200mg  daily estrdiol patch               0.05mg /day q3days 09/14/2013: Palpitations 10/27/2013: S/P trigger finger release No date: Vitamin D deficiency  Past Surgical History: 09/2012: COLONOSCOPY 2015: TRIGGER FINGER RELEASE; Left     Comment:  thumb, under local     Reproductive/Obstetrics negative OB ROS                             Anesthesia Physical Anesthesia Plan  ASA: III  Anesthesia Plan: General LMA   Post-op Pain Management:    Induction: Intravenous  PONV Risk Score and Plan: 4 or greater and Ondansetron, Dexamethasone, Midazolam and Treatment may  vary due to age or medical condition  Airway Management Planned: LMA  Additional Equipment:   Intra-op Plan:   Post-operative Plan: Extubation in OR  Informed Consent: I have reviewed the patients History and Physical, chart, labs and discussed the procedure including the risks, benefits and alternatives for the proposed anesthesia with the patient or authorized representative who has indicated his/her understanding and acceptance.   Dental Advisory Given  Plan Discussed with: Anesthesiologist, CRNA and Surgeon  Anesthesia Plan Comments: (Patient consented for risks of anesthesia including but not limited to:  - adverse reactions to medications - damage to teeth, lips or other oral mucosa - sore throat or hoarseness - Damage to heart, brain, lungs or loss of life  Patient voiced understanding.)        Anesthesia Quick Evaluation

## 2017-02-05 NOTE — Anesthesia Procedure Notes (Signed)
Procedure Name: LMA Insertion Date/Time: 02/05/2017 10:57 AM Performed by: Doreen Salvage Pre-anesthesia Checklist: Patient identified, Patient being monitored, Timeout performed, Emergency Drugs available and Suction available Patient Re-evaluated:Patient Re-evaluated prior to induction Oxygen Delivery Method: Circle system utilized Preoxygenation: Pre-oxygenation with 100% oxygen Induction Type: IV induction Ventilation: Mask ventilation without difficulty LMA: LMA inserted LMA Size: 3.5 Tube type: Oral Number of attempts: 1 Placement Confirmation: positive ETCO2 and breath sounds checked- equal and bilateral Tube secured with: Tape Dental Injury: Teeth and Oropharynx as per pre-operative assessment

## 2017-02-05 NOTE — Anesthesia Postprocedure Evaluation (Signed)
Anesthesia Post Note  Patient: [name]  Procedure(s) Performed: DILATATION AND CURETTAGE /HYSTEROSCOPY (N/A Vagina )  Patient location during evaluation: PACU Anesthesia Type: General Level of consciousness: awake and alert Pain management: pain level controlled Vital Signs Assessment: post-procedure vital signs reviewed and stable Respiratory status: spontaneous breathing, nonlabored ventilation, respiratory function stable and patient connected to nasal cannula oxygen Cardiovascular status: blood pressure returned to baseline and stable Postop Assessment: no apparent nausea or vomiting Anesthetic complications: no     Last Vitals:  Vitals:   02/05/17 1307 02/05/17 1320  BP: 119/73 126/80  Pulse: 70 76  Resp: 18 19  Temp:    SpO2: 100% 100%    Last Pain:  Vitals:   02/05/17 1320  TempSrc:   PainSc: 3                  Precious Haws [place]it[place]

## 2017-02-08 LAB — SURGICAL PATHOLOGY

## 2017-08-10 ENCOUNTER — Telehealth: Payer: Self-pay | Admitting: Gastroenterology

## 2017-08-10 NOTE — Telephone Encounter (Signed)
When is patient due for colonoscopy? Olivia Armstrong thinks its June 2019, if so she is ready to schedule.

## 2017-08-16 ENCOUNTER — Other Ambulatory Visit: Payer: Self-pay

## 2017-08-16 NOTE — Telephone Encounter (Signed)
Per Dr. Allen Norris, pt is due for a repeat colonoscopy in 5 years. Last colon done 2014.

## 2017-08-18 NOTE — Telephone Encounter (Signed)
LVM for pt to return my call.

## 2017-08-20 NOTE — Telephone Encounter (Signed)
Pt stated she has other GI concerns that she would like to discuss with Dr. Allen Norris. Office appt has been scheduled.

## 2017-09-23 ENCOUNTER — Encounter (INDEPENDENT_AMBULATORY_CARE_PROVIDER_SITE_OTHER): Payer: Self-pay

## 2017-09-23 ENCOUNTER — Ambulatory Visit: Payer: BLUE CROSS/BLUE SHIELD | Admitting: Gastroenterology

## 2017-09-23 ENCOUNTER — Other Ambulatory Visit: Payer: Self-pay

## 2017-09-23 ENCOUNTER — Encounter: Payer: Self-pay | Admitting: Gastroenterology

## 2017-09-23 VITALS — BP 126/70 | HR 82 | Ht 63.75 in | Wt 161.0 lb

## 2017-09-23 DIAGNOSIS — R197 Diarrhea, unspecified: Secondary | ICD-10-CM | POA: Diagnosis not present

## 2017-09-23 DIAGNOSIS — R634 Abnormal weight loss: Secondary | ICD-10-CM | POA: Diagnosis not present

## 2017-09-23 DIAGNOSIS — R112 Nausea with vomiting, unspecified: Secondary | ICD-10-CM | POA: Diagnosis not present

## 2017-09-23 DIAGNOSIS — R9389 Abnormal findings on diagnostic imaging of other specified body structures: Secondary | ICD-10-CM | POA: Insufficient documentation

## 2017-09-23 NOTE — Progress Notes (Signed)
Primary Care Physician: Idelle Crouch, MD  Primary Gastroenterologist:  Dr. Lucilla Lame  Chief Complaint  Patient presents with  . Diarrhea  . Bloated    HPI: Olivia Armstrong is a 52 y.o. female here for history of chronic diarrhea. The patient reports that she has had chronic nausea also.  Since the last few months she has lost a total of 13 pounds without trying.  She denies any black stools but she states that she does have some bloody stools when she is constipated and has to push.  There is no family history of colon cancer or colon polyps.  The patient was seen by me last year and was told to avoid dairy products to see if that helps her symptoms are which she states that did not.  The patient was told to contact me if she wasn't feeling better but had failed to do so until now.  Current Outpatient Medications  Medication Sig Dispense Refill  . b complex vitamins capsule Take by mouth.    . Dapsone (ACZONE) 7.5 % GEL Apply 1 application topically as needed.    Marland Kitchen escitalopram (LEXAPRO) 10 MG tablet TAKE 1 TABLET (10 MG TOTAL) BY MOUTH ONCE DAILY, at night    . estradiol (VIVELLE-DOT) 0.05 MG/24HR patch APPLY 1 PATCH ONTO SKIN TWICE A WEEK ON SUNDAY AND WEDNESDAY    . hydrocortisone 2.5 % ointment Apply topically 2 (two) times daily.    Marland Kitchen ibuprofen (ADVIL,MOTRIN) 200 MG tablet Take 400 mg by mouth every 6 (six) hours as needed for fever.    . Multiple Vitamin (MULTI-VITAMINS) TABS Take by mouth.    . progesterone (PROMETRIUM) 200 MG capsule TAKE 200 MG EVERY DAY BY ORAL ROUTE FOR 90 DAYS.    Marland Kitchen thyroid (ARMOUR THYROID) 30 MG tablet Take 30 mg by mouth daily before breakfast.     . tretinoin (RETIN-A) 0.05 % cream Apply 1 application topically at bedtime as needed (for skin care).    . Vitamin D, Ergocalciferol, (DRISDOL) 50000 units CAPS capsule Take 50,000 Units by mouth every Wednesday.     . Zinc Sulfate (ZINC 15 PO) Take 1 application by mouth daily.    Marland Kitchen zolpidem  (AMBIEN) 5 MG tablet Take 5 mg by mouth at bedtime as needed for sleep.     . Nutritional Supplements (CANDIDA COMPLEX PO) Take 2 tablets by mouth daily.    Marland Kitchen omeprazole (PRILOSEC) 40 MG capsule Take 40 mg by mouth every 3 (three) days.    Marland Kitchen OVER THE COUNTER MEDICATION Take 2 tablets by mouth daily.    . Probiotic Product ([employer] PROBIOTIC MAXIMUM STRENGTH) CAPS Take 1 capsule by mouth daily.     . ranitidine (ZANTAC) 300 MG tablet Take 300 mg by mouth at bedtime.  11   No current facility-administered medications for this visit.     Allergies as of 09/23/2017 - Review Complete 09/23/2017  Allergen Reaction Noted  . Kiwi extract Anaphylaxis 01/26/2017    ROS:  General: Negative for anorexia, weight loss, fever, chills, fatigue, weakness. ENT: Negative for hoarseness, difficulty swallowing , nasal congestion. CV: Negative for chest pain, angina, palpitations, dyspnea on exertion, peripheral edema.  Respiratory: Negative for dyspnea at rest, dyspnea on exertion, cough, sputum, wheezing.  GI: See history of present illness. GU:  Negative for dysuria, hematuria, urinary incontinence, urinary frequency, nocturnal urination.  Endo: Negative for unusual weight change.    Physical Examination:   BP 126/70   Pulse 82  Ht 5' 3.75" (1.619 m)   Wt 161 lb (73 kg)   BMI 27.85 kg/m   General: Well-nourished, well-developed in no acute distress.  Eyes: No icterus. Conjunctivae pink. Mouth: Oropharyngeal mucosa moist and pink , no lesions erythema or exudate. Lungs: Clear to auscultation bilaterally. Non-labored. Heart: Regular rate and rhythm, no murmurs rubs or gallops.  Abdomen: Bowel sounds are normal, nontender, nondistended, no hepatosplenomegaly or masses, no abdominal bruits or hernia , no rebound or guarding.   Extremities: No lower extremity edema. No clubbing or deformities. Neuro: Alert and oriented x 3.  Grossly intact. Skin: Warm and dry, no jaundice.   Psych: Alert and  cooperative, normal mood and affect.  Labs:    Imaging Studies: No results found.  Assessment and Plan:   Olivia Armstrong is a 52 y.o. y/o female who comes in with a history of diarrhea that she reports to have become worse and nausea that is not only in the morning anymore but can be off and on throughout the day.  The patient did try to avoid dairy products without any relief of her symptoms. The patient will be set up for an EGD and colonoscopy due to her change in bowel habits weight loss and nausea. I have discussed risks & benefits which include, but are not limited to, bleeding, infection, perforation & drug reaction.  The patient agrees with this plan & written consent will be obtained.       Lucilla Lame, MD. Marval Regal   Note: This dictation was prepared with Dragon dictation along with smaller phrase technology. Any transcriptional errors that result from this process are unintentional.

## 2017-09-27 ENCOUNTER — Other Ambulatory Visit: Payer: Self-pay

## 2017-09-27 DIAGNOSIS — R634 Abnormal weight loss: Secondary | ICD-10-CM

## 2017-09-27 DIAGNOSIS — R112 Nausea with vomiting, unspecified: Secondary | ICD-10-CM

## 2017-09-27 DIAGNOSIS — R197 Diarrhea, unspecified: Secondary | ICD-10-CM

## 2017-10-06 ENCOUNTER — Other Ambulatory Visit: Payer: Self-pay | Admitting: Obstetrics & Gynecology

## 2017-10-06 DIAGNOSIS — Z1231 Encounter for screening mammogram for malignant neoplasm of breast: Secondary | ICD-10-CM

## 2017-10-14 NOTE — Discharge Instructions (Signed)
General Anesthesia, Adult, Care After °These instructions provide you with information about caring for yourself after your procedure. Your health care provider may also give you more specific instructions. Your treatment has been planned according to current medical practices, but problems sometimes occur. Call your health care provider if you have any problems or questions after your procedure. °What can I expect after the procedure? °After the procedure, it is common to have: °· Vomiting. °· A sore throat. °· Mental slowness. ° °It is common to feel: °· Nauseous. °· Cold or shivery. °· Sleepy. °· Tired. °· Sore or achy, even in parts of your body where you did not have surgery. ° °Follow these instructions at home: °For at least 24 hours after the procedure: °· Do not: °? Participate in activities where you could fall or become injured. °? Drive. °? Use heavy machinery. °? Drink alcohol. °? Take sleeping pills or medicines that cause drowsiness. °? Make important decisions or sign legal documents. °? Take care of children on your own. °· Rest. °Eating and drinking °· If you vomit, drink water, juice, or soup when you can drink without vomiting. °· Drink enough fluid to keep your urine clear or pale yellow. °· Make sure you have little or no nausea before eating solid foods. °· Follow the diet recommended by your health care provider. °General instructions °· Have a responsible adult stay with you until you are awake and alert. °· Return to your normal activities as told by your health care provider. Ask your health care provider what activities are safe for you. °· Take over-the-counter and prescription medicines only as told by your health care provider. °· If you smoke, do not smoke without supervision. °· Keep all follow-up visits as told by your health care provider. This is important. °Contact a health care provider if: °· You continue to have nausea or vomiting at home, and medicines are not helpful. °· You  cannot drink fluids or start eating again. °· You cannot urinate after 8-12 hours. °· You develop a skin rash. °· You have fever. °· You have increasing redness at the site of your procedure. °Get help right away if: °· You have difficulty breathing. °· You have chest pain. °· You have unexpected bleeding. °· You feel that you are having a life-threatening or urgent problem. °This information is not intended to replace advice given to you by your health care provider. Make sure you discuss any questions you have with your health care provider. °Document Released: 07/13/2000 Document Revised: 09/09/2015 Document Reviewed: 03/21/2015 °Elsevier Interactive Patient Education © 2018 [employer] ° °

## 2017-10-15 ENCOUNTER — Ambulatory Visit: Payer: BLUE CROSS/BLUE SHIELD | Admitting: Anesthesiology

## 2017-10-15 ENCOUNTER — Ambulatory Visit
Admission: RE | Admit: 2017-10-15 | Discharge: 2017-10-15 | Disposition: A | Discharge status: Home / Self Care | Payer: BLUE CROSS/BLUE SHIELD | Source: Ambulatory Visit | Attending: Gastroenterology | Admitting: Gastroenterology

## 2017-10-15 ENCOUNTER — Encounter
Admission: RE | Disposition: A | Discharge status: Home / Self Care | Payer: Self-pay | Source: Ambulatory Visit | Attending: Gastroenterology

## 2017-10-15 DIAGNOSIS — K2950 Unspecified chronic gastritis without bleeding: Secondary | ICD-10-CM | POA: Diagnosis not present

## 2017-10-15 DIAGNOSIS — K219 Gastro-esophageal reflux disease without esophagitis: Secondary | ICD-10-CM | POA: Insufficient documentation

## 2017-10-15 DIAGNOSIS — Z800 Family history of malignant neoplasm of digestive organs: Secondary | ICD-10-CM | POA: Insufficient documentation

## 2017-10-15 DIAGNOSIS — Z79899 Other long term (current) drug therapy: Secondary | ICD-10-CM | POA: Insufficient documentation

## 2017-10-15 DIAGNOSIS — F419 Anxiety disorder, unspecified: Secondary | ICD-10-CM | POA: Insufficient documentation

## 2017-10-15 DIAGNOSIS — Z79890 Hormone replacement therapy (postmenopausal): Secondary | ICD-10-CM | POA: Diagnosis not present

## 2017-10-15 DIAGNOSIS — D122 Benign neoplasm of ascending colon: Secondary | ICD-10-CM | POA: Diagnosis not present

## 2017-10-15 DIAGNOSIS — Z791 Long term (current) use of non-steroidal anti-inflammatories (NSAID): Secondary | ICD-10-CM | POA: Diagnosis not present

## 2017-10-15 DIAGNOSIS — E559 Vitamin D deficiency, unspecified: Secondary | ICD-10-CM | POA: Insufficient documentation

## 2017-10-15 DIAGNOSIS — K6389 Other specified diseases of intestine: Secondary | ICD-10-CM | POA: Diagnosis not present

## 2017-10-15 DIAGNOSIS — F329 Major depressive disorder, single episode, unspecified: Secondary | ICD-10-CM | POA: Insufficient documentation

## 2017-10-15 DIAGNOSIS — R110 Nausea: Secondary | ICD-10-CM | POA: Diagnosis not present

## 2017-10-15 DIAGNOSIS — K529 Noninfective gastroenteritis and colitis, unspecified: Secondary | ICD-10-CM

## 2017-10-15 DIAGNOSIS — K2970 Gastritis, unspecified, without bleeding: Secondary | ICD-10-CM | POA: Diagnosis not present

## 2017-10-15 DIAGNOSIS — R634 Abnormal weight loss: Secondary | ICD-10-CM

## 2017-10-15 DIAGNOSIS — R112 Nausea with vomiting, unspecified: Secondary | ICD-10-CM

## 2017-10-15 DIAGNOSIS — R197 Diarrhea, unspecified: Secondary | ICD-10-CM

## 2017-10-15 HISTORY — PX: ESOPHAGOGASTRODUODENOSCOPY (EGD) WITH PROPOFOL: SHX5813

## 2017-10-15 HISTORY — PX: COLONOSCOPY WITH PROPOFOL: SHX5780

## 2017-10-15 HISTORY — PX: POLYPECTOMY: SHX5525

## 2017-10-15 SURGERY — COLONOSCOPY WITH PROPOFOL
Anesthesia: General

## 2017-10-15 MED ORDER — ACETAMINOPHEN 325 MG PO TABS: 325.0000 mg | ORAL_TABLET | ORAL | Status: DC | PRN

## 2017-10-15 MED ORDER — STERILE WATER FOR IRRIGATION IR SOLN
Status: DC | PRN
  Administered 2017-10-15: 5 mL

## 2017-10-15 MED ORDER — LIDOCAINE HCL (CARDIAC) PF 100 MG/5ML IV SOSY
PREFILLED_SYRINGE | INTRAVENOUS | Status: DC | PRN
  Administered 2017-10-15: 100 mg via INTRAVENOUS

## 2017-10-15 MED ORDER — LACTATED RINGERS IV SOLN
INTRAVENOUS | Status: DC
  Administered 2017-10-15: 10:00:00 via INTRAVENOUS

## 2017-10-15 MED ORDER — PROPOFOL 10 MG/ML IV BOLUS
INTRAVENOUS | Status: DC | PRN
  Administered 2017-10-15 (×3): 20 mg via INTRAVENOUS
  Administered 2017-10-15: 80 mg via INTRAVENOUS
  Administered 2017-10-15: 40 mg via INTRAVENOUS
  Administered 2017-10-15: 20 mg via INTRAVENOUS
  Administered 2017-10-15: 40 mg via INTRAVENOUS
  Administered 2017-10-15 (×2): 20 mg via INTRAVENOUS

## 2017-10-15 MED ORDER — ACETAMINOPHEN 160 MG/5ML PO SOLN: 325.0000 mg | ORAL | Status: DC | PRN

## 2017-10-15 MED ORDER — SODIUM CHLORIDE 0.9 % IV SOLN: INTRAVENOUS | Status: DC

## 2017-10-15 SURGICAL SUPPLY — 36 items
BALLN DILATOR 10-12 8 (BALLOONS)
BALLN DILATOR 12-15 8 (BALLOONS)
BALLN DILATOR 15-18 8 (BALLOONS)
BALLN DILATOR CRE 0-12 8 (BALLOONS)
BALLN DILATOR ESOPH 8 10 CRE (MISCELLANEOUS) IMPLANT
BALLOON DILATOR 12-15 8 (BALLOONS) IMPLANT
BALLOON DILATOR 15-18 8 (BALLOONS) IMPLANT
BALLOON DILATOR CRE 0-12 8 (BALLOONS) IMPLANT
BLOCK BITE 60FR ADLT L/F GRN (MISCELLANEOUS) ×3 IMPLANT
CANISTER SUCT 1200ML W/VALVE (MISCELLANEOUS) ×3 IMPLANT
CLIP HMST 235XBRD CATH ROT (MISCELLANEOUS) IMPLANT
CLIP RESOLUTION 360 11X235 (MISCELLANEOUS)
ELECT REM PT RETURN 9FT ADLT (ELECTROSURGICAL)
ELECTRODE REM PT RTRN 9FT ADLT (ELECTROSURGICAL) IMPLANT
FCP ESCP3.2XJMB 240X2.8X (MISCELLANEOUS)
FORCEPS BIOP RAD 4 LRG CAP 4 (CUTTING FORCEPS) ×3 IMPLANT
FORCEPS BIOP RJ4 240 W/NDL (MISCELLANEOUS)
FORCEPS ESCP3.2XJMB 240X2.8X (MISCELLANEOUS) IMPLANT
GOWN CVR UNV OPN BCK APRN NK (MISCELLANEOUS) ×2 IMPLANT
GOWN ISOL THUMB LOOP REG UNIV (MISCELLANEOUS) ×4
INJECTOR VARIJECT VIN23 (MISCELLANEOUS) IMPLANT
KIT DEFENDO VALVE AND CONN (KITS) IMPLANT
KIT ENDO PROCEDURE OLY (KITS) ×3 IMPLANT
MARKER SPOT ENDO TATTOO 5ML (MISCELLANEOUS) IMPLANT
PROBE APC STR FIRE (PROBE) IMPLANT
RETRIEVER NET PLAT FOOD (MISCELLANEOUS) IMPLANT
RETRIEVER NET ROTH 2.5X230 LF (MISCELLANEOUS) IMPLANT
SNARE SHORT THROW 13M SML OVAL (MISCELLANEOUS) IMPLANT
SNARE SHORT THROW 30M LRG OVAL (MISCELLANEOUS) IMPLANT
SNARE SNG USE RND 15MM (INSTRUMENTS) IMPLANT
SPOT EX ENDOSCOPIC TATTOO (MISCELLANEOUS)
SYR INFLATION 60ML (SYRINGE) IMPLANT
TRAP ETRAP POLY (MISCELLANEOUS) IMPLANT
VARIJECT INJECTOR VIN23 (MISCELLANEOUS)
WATER STERILE IRR 250ML POUR (IV SOLUTION) ×3 IMPLANT
WIRE CRE 18-20MM 8CM F G (MISCELLANEOUS) IMPLANT

## 2017-10-15 NOTE — Anesthesia Preprocedure Evaluation (Signed)
Anesthesia Evaluation  Patient identified by MRN, date of birth, ID band Patient awake    Reviewed: Allergy & Precautions, H&P , NPO status , Patient's Chart, lab work & pertinent test results, reviewed documented beta blocker date and time   Airway Mallampati: II  TM Distance: >3 FB Neck ROM: full    Dental no notable dental hx.    Pulmonary neg pulmonary ROS,    Pulmonary exam normal breath sounds clear to auscultation       Cardiovascular Exercise Tolerance: Good negative cardio ROS Normal cardiovascular exam Rhythm:regular Rate:Normal     Neuro/Psych negative neurological ROS  negative psych ROS   GI/Hepatic Neg liver ROS, GERD  ,  Endo/Other  negative endocrine ROS  Renal/GU negative Renal ROS  negative genitourinary   Musculoskeletal   Abdominal   Peds  Hematology negative hematology ROS (+)   Anesthesia Other Findings   Reproductive/Obstetrics negative OB ROS                             Anesthesia Physical Anesthesia Plan  ASA: II  Anesthesia Plan: General   Post-op Pain Management:    Induction:   PONV Risk Score and Plan:   Airway Management Planned:   Additional Equipment:   Intra-op Plan:   Post-operative Plan:   Informed Consent: I have reviewed the patients History and Physical, chart, labs and discussed the procedure including the risks, benefits and alternatives for the proposed anesthesia with the patient or authorized representative who has indicated his/her understanding and acceptance.   Dental Advisory Given  Plan Discussed with: CRNA and Anesthesiologist  Anesthesia Plan Comments:         Anesthesia Quick Evaluation

## 2017-10-15 NOTE — Op Note (Signed)
[facility] Gastroenterology Patient Name: Olivia Armstrong Procedure Date: 10/15/2017 10:18 AM MRN: [id number] Account #: [id number] Date of Birth: 04-29-1965 Admit Type: Outpatient Age: 52 Room: [facility] OR ROOM 01 Gender: Female Note Status: Finalized Procedure:            Colonoscopy Indications:          Chronic diarrhea Providers:            Lucilla Lame MD, MD Medicines:            Propofol per Anesthesia Complications:        No immediate complications. Procedure:            Pre-Anesthesia Assessment:                       - Prior to the procedure, a History and Physical was                        performed, and patient medications and allergies were                        reviewed. The patient's tolerance of previous                        anesthesia was also reviewed. The risks and benefits of                        the procedure and the sedation options and risks were                        discussed with the patient. All questions were                        answered, and informed consent was obtained. Prior                        Anticoagulants: The patient has taken no previous                        anticoagulant or antiplatelet agents. ASA Grade                        Assessment: II - A patient with mild systemic disease.                        After reviewing the risks and benefits, the patient was                        deemed in satisfactory condition to undergo the                        procedure.                       After obtaining informed consent, the colonoscope was                        passed under direct vision. Throughout the procedure,                        the patient's blood pressure, pulse, and oxygen  saturations were monitored continuously. The was                        introduced through the anus and advanced to the the                        terminal ileum. The patient tolerated the procedure            well. The quality of the bowel preparation was                        excellent. The colonoscopy was performed without                        difficulty. The patient tolerated the procedure well. Findings:      The perianal and digital rectal examinations were normal.      A 2 mm polyp was found in the ascending colon. The polyp was sessile.       The polyp was removed with a cold biopsy forceps. Resection and       retrieval were complete.      The terminal ileum appeared normal. Biopsies were taken with a cold       forceps for histology.      Random biopsies were obtained with cold forceps for histology randomly       in the entire colon. Impression:           - One 2 mm polyp in the ascending colon, removed with a                        cold biopsy forceps. Resected and retrieved.                       - The examined portion of the ileum was normal.                        Biopsied.                       - Random biopsies were obtained in the entire colon. Recommendation:       - Discharge patient to home.                       - Resume previous diet.                       - Continue present medications.                       - Await pathology results. Procedure Code(s):    --- Professional ---                       [phone], Colonoscopy, flexible; with biopsy, single or                        multiple Diagnosis Code(s):    --- Professional ---                       K52.9, Noninfective gastroenteritis and colitis,  unspecified                       D12.2, Benign neoplasm of ascending colon CPT copyright 2017 American Medical Association. All rights reserved. The codes documented in this report are preliminary and upon coder review may  be revised to meet current compliance requirements. Lucilla Lame MD, MD 10/15/2017 10:51:21 AM This report has been signed electronically. Number of Addenda: 0 Note Initiated On: 10/15/2017 10:18 AM Scope Withdrawal Time:  0 hours 9 minutes 34 seconds  Total Procedure Duration: 0 hours 12 minutes 39 seconds       [facility]

## 2017-10-15 NOTE — Op Note (Signed)
[facility] Gastroenterology Patient Name: Olivia Armstrong Procedure Date: 10/15/2017 10:12 AM MRN: [id number] Account #: [id number] Date of Birth: [birth year] Admit Type: Outpatient Age: 52 Room: [facility] OR ROOM 01 Gender: Female Note Status: Finalized Procedure:            Upper GI endoscopy Indications:          Diarrhea, Nausea Providers:            Lucilla Lame MD, MD Referring MD:         Leonie Douglas. Doy Hutching, MD (Referring MD) Medicines:            Propofol per Anesthesia Complications:        No immediate complications. Procedure:            Pre-Anesthesia Assessment:                       - Prior to the procedure, a History and Physical was                        performed, and patient medications and allergies were                        reviewed. The patient's tolerance of previous                        anesthesia was also reviewed. The risks and benefits of                        the procedure and the sedation options and risks were                        discussed with the patient. All questions were                        answered, and informed consent was obtained. Prior                        Anticoagulants: The patient has taken no previous                        anticoagulant or antiplatelet agents. ASA Grade                        Assessment: II - A patient with mild systemic disease.                        After reviewing the risks and benefits, the patient was                        deemed in satisfactory condition to undergo the                        procedure.                       After obtaining informed consent, the endoscope was                        passed under direct vision. Throughout the procedure,  the patient's blood pressure, pulse, and oxygen                        saturations were monitored continuously. The was                        introduced through the mouth, and advanced to the   second part of duodenum. The upper GI endoscopy was                        accomplished without difficulty. The patient tolerated                        the procedure well. Findings:      The examined esophagus was normal.      Localized minimal inflammation characterized by erythema was found in       the gastric antrum. Biopsies were taken with a cold forceps for       histology.      The examined duodenum was normal. Biopsies were taken with a cold       forceps for histology. Impression:           - Normal esophagus.                       - Gastritis. Biopsied.                       - Normal examined duodenum. Biopsied. Recommendation:       - Discharge patient to home.                       - Resume previous diet.                       - Continue present medications.                       - Await pathology results.                       - Perform a colonoscopy today. Procedure Code(s):    --- Professional ---                       [phone], Esophagogastroduodenoscopy, flexible, transoral;                        with biopsy, single or multiple Diagnosis Code(s):    --- Professional ---                       R11.0, Nausea                       R19.7, Diarrhea, unspecified                       K29.70, Gastritis, unspecified, without bleeding CPT copyright 2017 American Medical Association. All rights reserved. The codes documented in this report are preliminary and upon coder review may  be revised to meet current compliance requirements. Lucilla Lame MD, MD 10/15/2017 10:31:02 AM This report has been signed electronically. Number of Addenda: 0 Note Initiated On: 10/15/2017 10:12 AM      [facility]

## 2017-10-15 NOTE — Transfer of Care (Signed)
Immediate Anesthesia Transfer of Care Note  Patient: Olivia Armstrong  Procedure(s) Performed: COLONOSCOPY WITH PROPOFOL (N/A ) ESOPHAGOGASTRODUODENOSCOPY (EGD) WITH PROPOFOL (N/A ) POLYPECTOMY (N/A )  Patient Location: PACU  Anesthesia Type: General  Level of Consciousness: awake, alert  and patient cooperative  Airway and Oxygen Therapy: Patient Spontanous Breathing and Patient connected to supplemental oxygen  Post-op Assessment: Post-op Vital signs reviewed, Patient's Cardiovascular Status Stable, Respiratory Function Stable, Patent Airway and No signs of Nausea or vomiting  Post-op Vital Signs: Reviewed and stable  Complications: No apparent anesthesia complications

## 2017-10-15 NOTE — Anesthesia Procedure Notes (Signed)
Procedure Name: MAC Date/Time: 10/15/2017 10:21 AM Performed by: Lind Guest, CRNA Pre-anesthesia Checklist: Patient identified, Emergency Drugs available, Suction available, Patient being monitored and Timeout performed Patient Re-evaluated:Patient Re-evaluated prior to induction Oxygen Delivery Method: Nasal cannula

## 2017-10-15 NOTE — H&P (Signed)
Lucilla Lame, MD [address] [place], [place] [address] Phone:[phone] Fax : [phone]  Primary Care Physician:  Idelle Crouch, MD Primary Gastroenterologist:  Dr. Allen Norris  Pre-Procedure History & Physical: HPI:  Olivia Armstrong is a 52 y.o. female is here for an endoscopy and colonoscopy.   Past Medical History:  Diagnosis Date  . Anxiety and depression 09/14/2013  . Depression 07/30/2016  . Dysrhythmia    palpitations.  no treatment. saw dr. Marcelline Deist and had stress test but no diagnosis  . GERD (gastroesophageal reflux disease)    winding down on antacids  . Hyperlipidemia, unspecified 09/14/2013  . Hyperthyroidism   . Menopausal symptoms 06/10/2016   Overview:  prometrium 200mg  daily estrdiol patch 0.05mg /day q3days  . Palpitations 09/14/2013  . S/P trigger finger release 10/27/2013  . Vitamin D deficiency     Past Surgical History:  Procedure Laterality Date  . COLONOSCOPY  09/2012  . HYSTEROSCOPY W/D&C N/A 02/05/2017   Procedure: DILATATION AND CURETTAGE /HYSTEROSCOPY;  Surgeon: Ward, Honor Loh, MD;  Location: [facility] ORS;  Service: Gynecology;  Laterality: N/A;  . TRIGGER FINGER RELEASE Left 2015   thumb, under local    Prior to Admission medications   Medication Sig Start Date End Date Taking? Authorizing Provider  b complex vitamins capsule Take by mouth.   Yes [provider]  Dapsone (ACZONE) 7.5 % GEL Apply 1 application topically as needed.   Yes [provider]  escitalopram (LEXAPRO) 10 MG tablet TAKE 1 TABLET (10 MG TOTAL) BY MOUTH ONCE DAILY, at night 09/02/15  Yes [provider]  estradiol (VIVELLE-DOT) 0.05 MG/24HR patch APPLY 1 PATCH ONTO SKIN TWICE A WEEK ON SUNDAY AND WEDNESDAY 03/20/16  Yes [provider]  hydrocortisone 2.5 % ointment Apply topically 2 (two) times daily.   Yes [provider]  ibuprofen (ADVIL,MOTRIN) 200 MG tablet Take 400 mg by mouth every 6 (six) hours as needed for  fever.   Yes [provider]  Multiple Vitamin (MULTI-VITAMINS) TABS Take by mouth.   Yes [provider]  Nutritional Supplements (CANDIDA COMPLEX PO) Take 2 tablets by mouth daily.   Yes [provider]  Probiotic Product ([employer] PROBIOTIC MAXIMUM STRENGTH) CAPS Take 1 capsule by mouth daily.    Yes [provider]  progesterone (PROMETRIUM) 200 MG capsule TAKE 200 MG EVERY DAY BY ORAL ROUTE FOR 90 DAYS. 03/20/16  Yes [provider]  thyroid (ARMOUR THYROID) 30 MG tablet Take 30 mg by mouth daily before breakfast.  10/04/15  Yes [provider]  tretinoin (RETIN-A) 0.05 % cream Apply 1 application topically at bedtime as needed (for skin care).   Yes [provider]  Zinc Sulfate (ZINC 15 PO) Take 1 application by mouth daily.   Yes [provider]  zolpidem (AMBIEN) 5 MG tablet Take 5 mg by mouth at bedtime as needed for sleep.  05/21/16  Yes [provider]  omeprazole (PRILOSEC) 40 MG capsule Take 40 mg by mouth every 3 (three) days. 11/23/16 11/23/17  [provider]  OVER THE COUNTER MEDICATION Take 2 tablets by mouth daily.    [provider]  ranitidine (ZANTAC) 300 MG tablet Take 300 mg by mouth at bedtime. 12/26/16   [provider]  Vitamin D, Ergocalciferol, (DRISDOL) 50000 units CAPS capsule Take 50,000 Units by mouth every Wednesday.  05/18/16   [provider]    Allergies as of 09/27/2017 - Review Complete 09/23/2017  Allergen Reaction  Noted  . Kiwi extract Anaphylaxis 01/26/2017    Family History  Problem Relation Age of Onset  . Breast cancer Maternal Grandmother 90       mat great gm  . Colon cancer Sister     Social History   Socioeconomic History  . Marital status: Divorced    Spouse name: Not on file  . Number of children: Not on file  . Years of education: Not on file  . Highest education level: Not on file  Occupational History  . Not on file  Social  Needs  . Financial resource strain: Not on file  . Food insecurity:    Worry: Not on file    Inability: Not on file  . Transportation needs:    Medical: Not on file    Non-medical: Not on file  Tobacco Use  . Smoking status: Never Smoker  . Smokeless tobacco: Never Used  Substance and Sexual Activity  . Alcohol use: Yes    Comment: occasionally  . Drug use: No  . Sexual activity: Never  Lifestyle  . Physical activity:    Days per week: Not on file    Minutes per session: Not on file  . Stress: Not on file  Relationships  . Social connections:    Talks on phone: Not on file    Gets together: Not on file    Attends religious service: Not on file    Active member of club or organization: Not on file    Attends meetings of clubs or organizations: Not on file    Relationship status: Not on file  . Intimate partner violence:    Fear of current or ex partner: Not on file    Emotionally abused: Not on file    Physically abused: Not on file    Forced sexual activity: Not on file  Other Topics Concern  . Not on file  Social History Narrative  . Not on file    Review of Systems: See HPI, otherwise negative ROS  Physical Exam: BP (!) 145/88   Pulse 79   Temp 97.7 F (36.5 C) (Temporal)   Resp 16   Ht 5\' 4"  (1.626 m)   Wt 156 lb (70.8 kg)   SpO2 100%   BMI 26.78 kg/m  General:   Alert,  pleasant and cooperative in NAD Head:  Normocephalic and atraumatic. Neck:  Supple; no masses or thyromegaly. Lungs:  Clear throughout to auscultation.    Heart:  Regular rate and rhythm. Abdomen:  Soft, nontender and nondistended. Normal bowel sounds, without guarding, and without rebound.   Neurologic:  Alert and  oriented x4;  grossly normal neurologically.  Impression/Plan: [name] is here for an endoscopy and colonoscopy to be performed for nausea and diarrhea  Risks, benefits, limitations, and alternatives regarding  endoscopy and colonoscopy have been reviewed with  the patient.  Questions have been answered.  All parties agreeable.   Lucilla Lame, MD  10/15/2017, 9:49 AM

## 2017-10-15 NOTE — Anesthesia Postprocedure Evaluation (Signed)
Anesthesia Post Note  Patient: Olivia Armstrong  Procedure(s) Performed: COLONOSCOPY WITH PROPOFOL (N/A ) ESOPHAGOGASTRODUODENOSCOPY (EGD) WITH PROPOFOL (N/A ) POLYPECTOMY (N/A )  Patient location during evaluation: PACU Anesthesia Type: General Level of consciousness: awake and alert Pain management: pain level controlled Vital Signs Assessment: post-procedure vital signs reviewed and stable Respiratory status: spontaneous breathing, nonlabored ventilation, respiratory function stable and patient connected to nasal cannula oxygen Cardiovascular status: blood pressure returned to baseline and stable Postop Assessment: no apparent nausea or vomiting Anesthetic complications: no    Trecia Rogers

## 2017-10-19 ENCOUNTER — Ambulatory Visit
Admission: RE | Admit: 2017-10-19 | Discharge: 2017-10-19 | Disposition: A | Discharge status: Home / Self Care | Payer: BLUE CROSS/BLUE SHIELD | Source: Ambulatory Visit | Attending: Obstetrics & Gynecology | Admitting: Obstetrics & Gynecology

## 2017-10-19 DIAGNOSIS — Z1231 Encounter for screening mammogram for malignant neoplasm of breast: Secondary | ICD-10-CM | POA: Insufficient documentation

## 2017-10-25 ENCOUNTER — Encounter: Payer: Self-pay | Admitting: Gastroenterology

## 2017-10-27 ENCOUNTER — Telehealth: Payer: Self-pay | Admitting: Gastroenterology

## 2017-10-27 NOTE — Telephone Encounter (Signed)
Pt is calling for biopsy results. 

## 2017-10-27 NOTE — Telephone Encounter (Signed)
Pt left vm in regards to results

## 2017-10-27 NOTE — Telephone Encounter (Signed)
-----   Message from Lucilla Lame, MD sent at 10/26/2017  2:14 PM EDT ----- Please have the patient come in for a follow up. She will also need a repeat colonoscopy in 5 years.

## 2017-10-27 NOTE — Telephone Encounter (Signed)
Pt scheduled for a follow up appt to discuss results.

## 2017-11-03 ENCOUNTER — Ambulatory Visit (INDEPENDENT_AMBULATORY_CARE_PROVIDER_SITE_OTHER): Payer: BLUE CROSS/BLUE SHIELD | Admitting: Gastroenterology

## 2017-11-03 ENCOUNTER — Encounter: Payer: Self-pay | Admitting: Gastroenterology

## 2017-11-03 VITALS — BP 121/75 | HR 70 | Ht 63.0 in | Wt 160.5 lb

## 2017-11-03 DIAGNOSIS — K580 Irritable bowel syndrome with diarrhea: Secondary | ICD-10-CM

## 2017-11-03 MED ORDER — HYOSCYAMINE SULFATE 0.125 MG SL SUBL: 0.1250 mg | SUBLINGUAL_TABLET | SUBLINGUAL | 3 refills | Status: DC | PRN

## 2017-11-03 NOTE — Progress Notes (Signed)
Primary Care Physician: Idelle Crouch, MD  Primary Gastroenterologist:  Dr. Lucilla Lame  Chief Complaint  Patient presents with  . Follow up procedure results    HPI: Olivia Armstrong is a 52 y.o. female here for follow-up after having an EGD and colonoscopy.  The patient has been found to have a tubular adenoma in the colon and biopsies of the stomach showed mild chronic inflammation and the small bowel showed regenerative changes during the upper endoscopy.  There is no sign of any inflammatory bowel disease or celiac sprue. The biopsies of the terminal ileum did not show any similar changes.  The patient has tried to avoid milk and dairy products in the past.  Current Outpatient Medications  Medication Sig Dispense Refill  . b complex vitamins capsule Take by mouth.    . Dapsone (ACZONE) 7.5 % GEL Apply 1 application topically as needed.    Marland Kitchen escitalopram (LEXAPRO) 10 MG tablet TAKE 1 TABLET (10 MG TOTAL) BY MOUTH ONCE DAILY, at night    . estradiol (VIVELLE-DOT) 0.05 MG/24HR patch APPLY 1 PATCH ONTO SKIN TWICE A WEEK ON SUNDAY AND WEDNESDAY    . hydrocortisone 2.5 % ointment Apply topically 2 (two) times daily.    Marland Kitchen ibuprofen (ADVIL,MOTRIN) 200 MG tablet Take 400 mg by mouth every 6 (six) hours as needed for fever.    . Multiple Vitamin (MULTI-VITAMINS) TABS Take by mouth.    . Nutritional Supplements (CANDIDA COMPLEX PO) Take 2 tablets by mouth daily.    Marland Kitchen omeprazole (PRILOSEC) 40 MG capsule Take 40 mg by mouth every 3 (three) days.    Marland Kitchen OVER THE COUNTER MEDICATION Take 2 tablets by mouth daily.    . Probiotic Product ([employer] PROBIOTIC MAXIMUM STRENGTH) CAPS Take 1 capsule by mouth daily.     . progesterone (PROMETRIUM) 200 MG capsule TAKE 200 MG EVERY DAY BY ORAL ROUTE FOR 90 DAYS.    Marland Kitchen ranitidine (ZANTAC) 300 MG tablet Take 300 mg by mouth at bedtime.  11  . thyroid (ARMOUR THYROID) 30 MG tablet Take 30 mg by mouth daily before breakfast.     . tretinoin (RETIN-A) 0.05 %  cream Apply 1 application topically at bedtime as needed (for skin care).    . Vitamin D, Ergocalciferol, (DRISDOL) 50000 units CAPS capsule Take 50,000 Units by mouth every Wednesday.     . Zinc Sulfate (ZINC 15 PO) Take 1 application by mouth daily.    Marland Kitchen zolpidem (AMBIEN) 5 MG tablet Take 5 mg by mouth at bedtime as needed for sleep.     . hyoscyamine (LEVSIN SL) 0.125 MG SL tablet Place 1 tablet (0.125 mg total) under the tongue every 4 (four) hours as needed. 60 tablet 3   No current facility-administered medications for this visit.     Allergies as of 11/03/2017 - Review Complete 11/03/2017  Allergen Reaction Noted  . Kiwi extract Anaphylaxis 01/26/2017    ROS:  General: Negative for anorexia, weight loss, fever, chills, fatigue, weakness. ENT: Negative for hoarseness, difficulty swallowing , nasal congestion. CV: Negative for chest pain, angina, palpitations, dyspnea on exertion, peripheral edema.  Respiratory: Negative for dyspnea at rest, dyspnea on exertion, cough, sputum, wheezing.  GI: See history of present illness. GU:  Negative for dysuria, hematuria, urinary incontinence, urinary frequency, nocturnal urination.  Endo: Negative for unusual weight change.    Physical Examination:   BP 121/75   Pulse 70   Ht 5\' 3"  (1.6 m)   Wt  160 lb 8 oz (72.8 kg)   BMI 28.43 kg/m   General: Well-nourished, well-developed in no acute distress.  Eyes: No icterus. Conjunctivae pink. Mouth: Oropharyngeal mucosa moist and pink , no lesions erythema or exudate. Lungs: Clear to auscultation bilaterally. Non-labored. Heart: Regular rate and rhythm, no murmurs rubs or gallops.  Abdomen: Bowel sounds are normal, nontender, nondistended, no hepatosplenomegaly or masses, no abdominal bruits or hernia , no rebound or guarding.   Extremities: No lower extremity edema. No clubbing or deformities. Neuro: Alert and oriented x 3.  Grossly intact. Skin: Warm and dry, no jaundice.   Psych: Alert  and cooperative, normal mood and affect.  Labs:    Imaging Studies: Mm 3d Screen Breast Bilateral  Result Date: 10/19/2017 CLINICAL DATA:  Screening. EXAM: DIGITAL SCREENING BILATERAL MAMMOGRAM WITH TOMO AND CAD COMPARISON:  Previous exam(s). ACR Breast Density Category b: There are scattered areas of fibroglandular density. FINDINGS: There are no findings suspicious for malignancy. Images were processed with CAD. IMPRESSION: No mammographic evidence of malignancy. A result letter of this screening mammogram will be mailed directly to the patient. RECOMMENDATION: Screening mammogram in one year. (Code:SM-B-01Y) BI-RADS CATEGORY  1: Negative. Electronically Signed   By: Curlene Dolphin M.D.   On: 10/19/2017 12:54    Assessment and Plan:   Olivia Armstrong is a 52 y.o. y/o female who comes in today with a history of an EGD and colonoscopy showing her to have an adenomatous polyp and the need for repeat colonoscopy in 5 years.  The patient's random colon biopsies and small bowel biopsies did not show any inflammatory bowel disease.  The patient has been explained that it is likely irritable bowel syndrome that is causing her symptoms.  She also reports that she has episodes of cramps.  The patient's diarrhea is usually only in the morning and she may have a very small soft bowel movement before she goes to sleep.  The patient has been told to try and take fiber before she goes to sleep to help solidify her morning stools and will be given a prescription for hyoscyamine 0.125 mg to be taken sublingually when she has the abdominal cramping.  The patient has been explained the plan and agrees with it.    Lucilla Lame, MD. Marval Regal   Note: This dictation was prepared with Dragon dictation along with smaller phrase technology. Any transcriptional errors that result from this process are unintentional.

## 2017-12-31 ENCOUNTER — Other Ambulatory Visit: Payer: Self-pay

## 2018-01-05 ENCOUNTER — Encounter
Admission: RE | Disposition: A | Discharge status: Home / Self Care | Payer: Self-pay | Source: Ambulatory Visit | Attending: Surgery

## 2018-01-05 ENCOUNTER — Ambulatory Visit: Payer: BLUE CROSS/BLUE SHIELD | Admitting: Anesthesiology

## 2018-01-05 ENCOUNTER — Ambulatory Visit
Admission: RE | Admit: 2018-01-05 | Discharge: 2018-01-05 | Disposition: A | Discharge status: Home / Self Care | Payer: BLUE CROSS/BLUE SHIELD | Source: Ambulatory Visit | Attending: Surgery | Admitting: Surgery

## 2018-01-05 DIAGNOSIS — Z87891 Personal history of nicotine dependence: Secondary | ICD-10-CM | POA: Insufficient documentation

## 2018-01-05 DIAGNOSIS — K219 Gastro-esophageal reflux disease without esophagitis: Secondary | ICD-10-CM | POA: Insufficient documentation

## 2018-01-05 DIAGNOSIS — E039 Hypothyroidism, unspecified: Secondary | ICD-10-CM | POA: Diagnosis not present

## 2018-01-05 DIAGNOSIS — F418 Other specified anxiety disorders: Secondary | ICD-10-CM | POA: Diagnosis not present

## 2018-01-05 DIAGNOSIS — E785 Hyperlipidemia, unspecified: Secondary | ICD-10-CM | POA: Diagnosis not present

## 2018-01-05 DIAGNOSIS — G5602 Carpal tunnel syndrome, left upper limb: Secondary | ICD-10-CM | POA: Insufficient documentation

## 2018-01-05 DIAGNOSIS — Z79899 Other long term (current) drug therapy: Secondary | ICD-10-CM | POA: Insufficient documentation

## 2018-01-05 HISTORY — PX: CARPAL TUNNEL RELEASE: SHX101

## 2018-01-05 SURGERY — RELEASE, CARPAL TUNNEL, ENDOSCOPIC
Anesthesia: Regional | Site: Hand | Laterality: Left

## 2018-01-05 MED ORDER — ACETAMINOPHEN 325 MG PO TABS: 325.0000 mg | ORAL_TABLET | ORAL | Status: DC | PRN

## 2018-01-05 MED ORDER — MIDAZOLAM HCL 2 MG/2ML IJ SOLN
INTRAMUSCULAR | Status: DC | PRN
  Administered 2018-01-05: 2 mg via INTRAVENOUS

## 2018-01-05 MED ORDER — POTASSIUM CHLORIDE IN NACL 20-0.9 MEQ/L-% IV SOLN: INTRAVENOUS | Status: DC

## 2018-01-05 MED ORDER — LIDOCAINE HCL (CARDIAC) PF 100 MG/5ML IV SOSY
PREFILLED_SYRINGE | INTRAVENOUS | Status: DC | PRN
  Administered 2018-01-05: 40 mg via INTRAVENOUS

## 2018-01-05 MED ORDER — ONDANSETRON HCL 4 MG/2ML IJ SOLN
INTRAMUSCULAR | Status: DC | PRN
  Administered 2018-01-05: 4 mg via INTRAVENOUS

## 2018-01-05 MED ORDER — ACETAMINOPHEN 160 MG/5ML PO SOLN: 325.0000 mg | ORAL | Status: DC | PRN

## 2018-01-05 MED ORDER — OXYCODONE HCL 5 MG/5ML PO SOLN: 5.0000 mg | Freq: Once | ORAL | Status: DC | PRN

## 2018-01-05 MED ORDER — BUPIVACAINE HCL (PF) 0.5 % IJ SOLN
INTRAMUSCULAR | Status: DC | PRN
  Administered 2018-01-05: 10 mL

## 2018-01-05 MED ORDER — FENTANYL CITRATE (PF) 100 MCG/2ML IJ SOLN
INTRAMUSCULAR | Status: DC | PRN
  Administered 2018-01-05: 100 ug via INTRAVENOUS

## 2018-01-05 MED ORDER — METOCLOPRAMIDE HCL 5 MG PO TABS: 5.0000 mg | ORAL_TABLET | Freq: Three times a day (TID) | ORAL | Status: DC | PRN

## 2018-01-05 MED ORDER — HYDROCODONE-ACETAMINOPHEN 5-325 MG PO TABS: 1.0000 | ORAL_TABLET | ORAL | Status: DC | PRN

## 2018-01-05 MED ORDER — METOCLOPRAMIDE HCL 5 MG/ML IJ SOLN: 5.0000 mg | Freq: Three times a day (TID) | INTRAMUSCULAR | Status: DC | PRN

## 2018-01-05 MED ORDER — CEFAZOLIN SODIUM 1 G IJ SOLR
2000.0000 mg | Freq: Once | INTRAMUSCULAR | Status: AC
  Administered 2018-01-05: 2000 mg via INTRAVENOUS

## 2018-01-05 MED ORDER — PROPOFOL 500 MG/50ML IV EMUL
INTRAVENOUS | Status: DC | PRN
  Administered 2018-01-05: 50 ug/kg/min via INTRAVENOUS

## 2018-01-05 MED ORDER — ONDANSETRON HCL 4 MG/2ML IJ SOLN: 4.0000 mg | Freq: Four times a day (QID) | INTRAMUSCULAR | Status: DC | PRN

## 2018-01-05 MED ORDER — LACTATED RINGERS IV SOLN
10.0000 mL/h | INTRAVENOUS | Status: DC
  Administered 2018-01-05: 10 mL/h via INTRAVENOUS

## 2018-01-05 MED ORDER — ONDANSETRON HCL 4 MG PO TABS: 4.0000 mg | ORAL_TABLET | Freq: Four times a day (QID) | ORAL | Status: DC | PRN

## 2018-01-05 MED ORDER — HYDROCODONE-ACETAMINOPHEN 5-325 MG PO TABS: 1.0000 | ORAL_TABLET | Freq: Four times a day (QID) | ORAL | 0 refills | Status: DC | PRN

## 2018-01-05 MED ORDER — OXYCODONE HCL 5 MG PO TABS: 5.0000 mg | ORAL_TABLET | Freq: Once | ORAL | Status: DC | PRN

## 2018-01-05 SURGICAL SUPPLY — 27 items
BANDAGE ELASTIC 2 LF NS (GAUZE/BANDAGES/DRESSINGS) ×3 IMPLANT
BNDG COHESIVE 4X5 TAN STRL (GAUZE/BANDAGES/DRESSINGS) ×3 IMPLANT
BNDG ESMARK 4X12 TAN STRL LF (GAUZE/BANDAGES/DRESSINGS) ×3 IMPLANT
CHLORAPREP W/TINT 26ML (MISCELLANEOUS) ×3 IMPLANT
CORD BIP STRL DISP 12FT (MISCELLANEOUS) ×3 IMPLANT
COVER LIGHT HANDLE UNIVERSAL (MISCELLANEOUS) ×6 IMPLANT
CUFF TOURNIQUET DUAL PORT 18X3 (MISCELLANEOUS) ×3 IMPLANT
DRAPE SURG 17X11 SM STRL (DRAPES) ×3 IMPLANT
GAUZE PETRO XEROFOAM 1X8 (MISCELLANEOUS) ×3 IMPLANT
GAUZE SPONGE 4X4 12PLY STRL (GAUZE/BANDAGES/DRESSINGS) ×3 IMPLANT
GLOVE BIO SURGEON STRL SZ8 (GLOVE) ×5 IMPLANT
GLOVE INDICATOR 8.0 STRL GRN (GLOVE) ×5 IMPLANT
GOWN STRL REUS W/ TWL LRG LVL3 (GOWN DISPOSABLE) ×1 IMPLANT
GOWN STRL REUS W/ TWL XL LVL3 (GOWN DISPOSABLE) ×1 IMPLANT
GOWN STRL REUS W/TWL LRG LVL3 (GOWN DISPOSABLE) ×2
GOWN STRL REUS W/TWL XL LVL3 (GOWN DISPOSABLE) ×2
KIT CARPAL TUNNEL (MISCELLANEOUS) ×2
KIT ESCP INSRT D SLOT CANN KN (MISCELLANEOUS) ×1 IMPLANT
KIT TURNOVER KIT A (KITS) ×3 IMPLANT
NS IRRIG 500ML POUR BTL (IV SOLUTION) ×3 IMPLANT
PACK EXTREMITY ARMC (MISCELLANEOUS) ×3 IMPLANT
SPLINT WRIST M RT TX990303 (SOFTGOODS) ×2 IMPLANT
STOCKINETTE IMPERVIOUS 9X36 MD (GAUZE/BANDAGES/DRESSINGS) ×3 IMPLANT
STRAP BODY AND KNEE 60X3 (MISCELLANEOUS) ×3 IMPLANT
SUT PROLENE 4 0 PS 2 18 (SUTURE) ×3 IMPLANT
SUT VIC AB 3-0 SH 27 (SUTURE)
SUT VIC AB 3-0 SH 27X BRD (SUTURE) IMPLANT

## 2018-01-05 NOTE — Op Note (Signed)
01/05/2018  11:37 AM  Patient:   Olivia Armstrong  Pre-Op Diagnosis:   Left carpal tunnel syndrome.  Post-Op Diagnosis:   Same.  Procedure:   Endoscopic left carpal tunnel release.  Surgeon:   Pascal Lux, MD  Anesthesia:   Bier block  Findings:   As above.  Complications:   None  EBL:   0 cc  Fluids:   650 cc crystalloid  TT:   25 minutes at 250 mmHg  Drains:   None  Closure:   4-0 Prolene interrupted sutures  Brief Clinical Note:   The patient is a 52 year old female with a history of pain and paresthesias to her left hand. Her symptoms have persisted despite medications, activity modification, splinting, etc. Her history and examination are consistent with carpal tunnel syndrome confirmed by EMG. The patient presents at this time for an endoscopic left carpal tunnel release.   Procedure:   The patient was brought into the operating room and lain in the supine position. After adequate IV sedation was achieved, a timeout was performed to verify the appropriate surgical site before a Bier block was placed by the anesthesiologist and the tourniquet inflated to 250 mmHg. The left hand and upper extremity were prepped with ChloraPrep solution before being draped sterilely. Preoperative antibiotics were administered. An approximately 1.5-2 cm incision was made over the volar wrist flexion crease, centered over the palmaris longus tendon. The incision was carried down through the subcutaneous tissues with care taken to identify and protect any neurovascular structures. The distal forearm fascia was penetrated just proximal to the transverse carpal ligament. The soft tissues were released off the superficial and deep surfaces of the distal forearm fascia and this was released proximally for 3-4 cm under direct visualization.  Attention was directed distally. The Soil scientist was passed beneath the transverse carpal ligament along the ulnar aspect of the carpal tunnel and used to  release any adhesions as well as to remove any adherent synovial tissue before first the smaller then the larger of the two dilators were passed beneath the transverse carpal ligament along the ulnar margin of the carpal tunnel. The slotted cannula was introduced and the endoscope was placed into the slotted cannula and the undersurface of the transverse carpal ligament visualized. The distal margin of the transverse carpal ligament was marked by placing a 25-gauge needle percutaneously at [place] cardinal point so that it entered the distal portion of the slotted cannula. Under endoscopic visualization, the transverse carpal ligament was released from proximal to distal using the end-cutting blade. A second pass was performed to ensure complete release of the ligament. The adequacy of release was verified both endoscopically and by palpation using the freer elevator.  The wound was irrigated thoroughly with sterile saline solution before being closed using 4-0 Prolene interrupted sutures. A total of 10 cc of 0.5% plain Sensorcaine was injected in and around the incision before a sterile bulky dressing was applied to the wound. The patient was placed into a volar wrist splint before being awakened and returned to the recovery room in satisfactory condition after tolerating the procedure well.

## 2018-01-05 NOTE — Transfer of Care (Signed)
Immediate Anesthesia Transfer of Care Note  Patient: Olivia Armstrong  Procedure(s) Performed: CARPAL TUNNEL RELEASE ENDOSCOPIC (Left Hand)  Patient Location: PACU  Anesthesia Type: General, Bier Block  Level of Consciousness: awake, alert  and patient cooperative  Airway and Oxygen Therapy: Patient Spontanous Breathing and Patient connected to supplemental oxygen  Post-op Assessment: Post-op Vital signs reviewed, Patient's Cardiovascular Status Stable, Respiratory Function Stable, Patent Airway and No signs of Nausea or vomiting  Post-op Vital Signs: Reviewed and stable  Complications: No apparent anesthesia complications

## 2018-01-05 NOTE — H&P (Signed)
Paper H&P to be scanned into permanent record. H&P reviewed and patient re-examined. No changes. 

## 2018-01-05 NOTE — Anesthesia Procedure Notes (Signed)
Performed by: Nashon Erbes, CRNA Pre-anesthesia Checklist: Patient identified, Emergency Drugs available, Suction available, Timeout performed and Patient being monitored Patient Re-evaluated:Patient Re-evaluated prior to induction Oxygen Delivery Method: Simple face mask Placement Confirmation: positive ETCO2       

## 2018-01-05 NOTE — Anesthesia Procedure Notes (Signed)
Anesthesia Regional Block: Bier block (IV Regional)   Pre-Anesthetic Checklist: ,, timeout performed, Correct Patient, Correct Site, Correct Laterality, Correct Procedure, Correct Position, site marked, Risks and benefits discussed,  Surgical consent,  Pre-op evaluation,  At surgeon's request and post-op pain management  Laterality: Left      Procedures:,,,,, intact distal pulses, Esmarch exsanguination,, #20gu IV placed and double tourniquet utilized  Narrative:  Start time: 01/05/2018 11:06 AM End time: 01/05/2018 11:07 AM Injection made incrementally with aspirations every 5 mL.  Additional Notes: After exsanguination. Proximal and distal cuffs inflated, then distal cuff deflated. Pulse check negative. Injected 61mL plain 0.5% Lidocaine to L hand. Tolerated well by pt.

## 2018-01-05 NOTE — Anesthesia Postprocedure Evaluation (Signed)
Anesthesia Post Note  Patient: Olivia Armstrong  Procedure(s) Performed: CARPAL TUNNEL RELEASE ENDOSCOPIC (Left Hand)  Patient location during evaluation: PACU Anesthesia Type: Bier Block Level of consciousness: awake and alert and oriented Pain management: satisfactory to patient Vital Signs Assessment: post-procedure vital signs reviewed and stable Respiratory status: spontaneous breathing, nonlabored ventilation and respiratory function stable Cardiovascular status: blood pressure returned to baseline and stable Postop Assessment: Adequate PO intake and No signs of nausea or vomiting Anesthetic complications: no    Raliegh Ip

## 2018-01-05 NOTE — Discharge Instructions (Signed)
General Anesthesia, Adult, Care After These instructions provide you with information about caring for yourself after your procedure. Your health care provider may also give you more specific instructions. Your treatment has been planned according to current medical practices, but problems sometimes occur. Call your health care provider if you have any problems or questions after your procedure. What can I expect after the procedure? After the procedure, it is common to have:  Vomiting.  A sore throat.  Mental slowness.  It is common to feel:  Nauseous.  Cold or shivery.  Sleepy.  Tired.  Sore or achy, even in parts of your body where you did not have surgery.  Follow these instructions at home: For at least 24 hours after the procedure:  Do not: ? Participate in activities where you could fall or become injured. ? Drive. ? Use heavy machinery. ? Drink alcohol. ? Take sleeping pills or medicines that cause drowsiness. ? Make important decisions or sign legal documents. ? Take care of children on your own.  Rest. Eating and drinking  If you vomit, drink water, juice, or soup when you can drink without vomiting.  Drink enough fluid to keep your urine clear or pale yellow.  Make sure you have little or no nausea before eating solid foods.  Follow the diet recommended by your health care provider. General instructions  Have a responsible adult stay with you until you are awake and alert.  Return to your normal activities as told by your health care provider. Ask your health care provider what activities are safe for you.  Take over-the-counter and prescription medicines only as told by your health care provider.  If you smoke, do not smoke without supervision.  Keep all follow-up visits as told by your health care provider. This is important. Contact a health care provider if:  You continue to have nausea or vomiting at home, and medicines are not helpful.  You  cannot drink fluids or start eating again.  You cannot urinate after 8-12 hours.  You develop a skin rash.  You have fever.  You have increasing redness at the site of your procedure. Get help right away if:  You have difficulty breathing.  You have chest pain.  You have unexpected bleeding.  You feel that you are having a life-threatening or urgent problem. This information is not intended to replace advice given to you by your health care provider. Make sure you discuss any questions you have with your health care provider. Document Released: 07/13/2000 Document Revised: 09/09/2015 Document Reviewed: 03/21/2015 Elsevier Interactive Patient Education  2018 [place] discharge instructions: Keep dressing dry and intact. Keep hand elevated above heart level. May shower after dressing removed on postop day 4 (Sunday). Cover sutures with Band-Aids after drying off. Apply ice to affected area frequently. Take ibuprofen 400-600 mg TID with meals for 7-10 days, then as necessary. Take ES Tylenol or pain medication as prescribed when needed.  Return for follow-up in 10-14 days or as scheduled.

## 2018-01-05 NOTE — Anesthesia Preprocedure Evaluation (Signed)
Anesthesia Evaluation  Patient identified by MRN, date of birth, ID band Patient awake    Reviewed: Allergy & Precautions, H&P , NPO status , Patient's Chart, lab work & pertinent test results  Airway Mallampati: II  TM Distance: >3 FB Neck ROM: full    Dental no notable dental hx.    Pulmonary    Pulmonary exam normal breath sounds clear to auscultation       Cardiovascular Normal cardiovascular exam Rhythm:regular Rate:Normal     Neuro/Psych    GI/Hepatic GERD  ,  Endo/Other  Hyperthyroidism   Renal/GU      Musculoskeletal   Abdominal   Peds  Hematology   Anesthesia Other Findings   Reproductive/Obstetrics                             Anesthesia Physical Anesthesia Plan  ASA: II  Anesthesia Plan: General and Bier Block and Bier Block-Lidocaine Only   Post-op Pain Management:    Induction: Intravenous  PONV Risk Score and Plan: 3 and Propofol infusion and Treatment may vary due to age or medical condition  Airway Management Planned: Natural Airway  Additional Equipment:   Intra-op Plan:   Post-operative Plan:   Informed Consent: I have reviewed the patients History and Physical, chart, labs and discussed the procedure including the risks, benefits and alternatives for the proposed anesthesia with the patient or authorized representative who has indicated his/her understanding and acceptance.     Plan Discussed with: CRNA  Anesthesia Plan Comments:         Anesthesia Quick Evaluation

## 2018-01-06 ENCOUNTER — Encounter: Payer: Self-pay | Admitting: Surgery

## 2018-02-09 DIAGNOSIS — F324 Major depressive disorder, single episode, in partial remission: Secondary | ICD-10-CM

## 2018-02-18 ENCOUNTER — Ambulatory Visit (INDEPENDENT_AMBULATORY_CARE_PROVIDER_SITE_OTHER): Payer: BLUE CROSS/BLUE SHIELD | Admitting: Psychiatry

## 2018-02-18 DIAGNOSIS — F330 Major depressive disorder, recurrent, mild: Secondary | ICD-10-CM

## 2018-02-20 NOTE — Progress Notes (Signed)
Patient ID: Olivia Armstrong, female   DOB: [birth year], 52 y.o.   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  This has been a long-term goal for the client which is why she completed her undergraduate degree in biology.  She states, "I am finally where I need to be." I had seen this client in my private practice back in 2015.  She ended treatment with services ended by mutual consent. Today the client states that she has some triggers that produce anger.  In the recent past she had left the church that she was volunteering with with the audiovisual team.  The communication was poor.  It got to a point that the client became so frustrated that she quit.  She realized that there are certain relationships that cause her to get angry.  The first when she identified is with the pastor of her previous church.  She was upset with him because of his procrastination and how he would not address the heart issues.  The second relationship is her sister.  The clients younger sister has recently been dating a man that she knew from years ago.  When she is with her sister she states she feels like a third wheel.  She feels very angry sad and disappointed. I did use some eye movement desensitization and reprocessing today with the client.  She realized that she felt left out and it was tapping into her abandonment issues. Currently the client is taking Lexapro 10 mg which seems to address her anxiety and depressive issues.  Client currently has more depressive symptoms than anxiety wants. Biological: Sleep-no change, appetite-decreased Psychotic symptoms: None Manic symptoms: None  Anxiety symptoms: Anxious/nervous.  No panic episodes. Depressive symptoms: Sadness, hypoactivity, loss of interest, worthlessness, irritability, anhedonia. Alcohol use\abuse: None Drug use\abuse: None  Antisocial behavior: None Danger to  others: None Danger to self: No suicidal or homicidal ideation.  Random thoughts of death with no intent to harm.   Psychiatric history: Outpatient: The client was seen by Olivia Armstrong, Olivia Armstrong from 2013-2015.  Inpatient: None Family history: The client is in the middle of 3 sisters.  All of them live in [place].  All 3 sisters are divorced.  Her mother has passed as well as her father.  The client has no biological children and is currently not in a relationship.  The client has no history of sexual or physical abuse.  She resides by herself. 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 Military history: None Current medications: Lexapro 10 mg

## 2018-02-20 NOTE — Progress Notes (Signed)
[facility] Counsel[facility] Initial Adult Exam- Part I  Name: Olivia Armstrong Date: 02/18/2018 MRN: [id number] DOB: 12/02/1965 PCP: Idelle Crouch, MD  Time spent: 58 minutes   Guardian/Payee:None    Paperwork requested:  No   Reason for Visit /Presenting Problem:  Chief Complaint  Patient presents with  . Depression    Mental Status Exam:   Appearance:   Well Groomed     Behavior:  Appropriate  Motor:  Normal  Speech/Language:   Clear and Coherent  Affect:  Depressed  Mood:  irritable and sad  Thought process:  normal  Thought content:    WNL  Sensory/Perceptual disturbances:    WNL  Orientation:  oriented to person, place, time/date and situation  Attention:  Good  Concentration:  Good  Memory:  WNL  Fund of knowledge:   Good  Insight:    Good  Judgment:   Good  Impulse Control:  Good   Reported Symptoms:  Anger/irritability  Risk Assessment: Danger to Self:  No Self-injurious Behavior: No Danger to Others: No Duty to Warn:no Physical Aggression / Violence:No  Access to Firearms a concern: No  Gang Involvement:No  Patient / guardian was educated about steps to take if suicide or homicide risk level increases between visits: n/a While future psychiatric events cannot be accurately predicted, the patient does not currently require acute inpatient psychiatric care and does not currently meet [facility] involuntary commitment criteria.  Substance Abuse History: Current substance abuse: No     Past Psychiatric History:   Previous psychological history is significant for anxiety and depression Outpatient Providers:Fredrick A. Juliyah Mergen, LPC History of Psych Hospitalization: No  Psychological Testing: NA    Medical History/Surgical History:not reviewed Past Medical History:  Diagnosis Date  . Anxiety and depression 09/14/2013  . Depression 07/30/2016  . Dysrhythmia    palpitations.  no treatment. saw dr. Marcelline Deist and had stress test but no diagnosis  . GERD  (gastroesophageal reflux disease)    winding down on antacids  . Hyperlipidemia, unspecified 09/14/2013  . Hyperthyroidism   . Menopausal symptoms 06/10/2016   Overview:  prometrium 200mg  daily estrdiol patch 0.05mg /day q3days  . Palpitations 09/14/2013  . S/P trigger finger release 10/27/2013  . Vitamin D deficiency     Past Surgical History:  Procedure Laterality Date  . CARPAL TUNNEL RELEASE Left 01/05/2018   Procedure: CARPAL TUNNEL RELEASE ENDOSCOPIC;  Surgeon: Corky Mull, MD;  Location: [place];  Service: Orthopedics;  Laterality: Left;  . COLONOSCOPY  09/2012  . COLONOSCOPY WITH PROPOFOL N/A 10/15/2017   Procedure: COLONOSCOPY WITH PROPOFOL;  Surgeon: Lucilla Lame, MD;  Location: [place];  Service: Endoscopy;  Laterality: N/A;  . ESOPHAGOGASTRODUODENOSCOPY (EGD) WITH PROPOFOL N/A 10/15/2017   Procedure: ESOPHAGOGASTRODUODENOSCOPY (EGD) WITH PROPOFOL;  Surgeon: Lucilla Lame, MD;  Location: [place];  Service: Endoscopy;  Laterality: N/A;  . HYSTEROSCOPY W/D&C N/A 02/05/2017   Procedure: DILATATION AND CURETTAGE /HYSTEROSCOPY;  Surgeon: Ward, Honor Loh, MD;  Location: [facility];  Service: Gynecology;  Laterality: N/A;  . POLYPECTOMY N/A 10/15/2017   Procedure: POLYPECTOMY;  Surgeon: Lucilla Lame, MD;  Location: [place];  Service: Endoscopy;  Laterality: N/A;  . TRIGGER FINGER RELEASE Left 2015   thumb, under local    Medications: Current Outpatient Medications  Medication Sig Dispense Refill  . b complex vitamins capsule Take by mouth.    . Dapsone (ACZONE) 7.5 % GEL Apply 1 application topically as needed.    Marland Kitchen escitalopram (LEXAPRO) 10  MG tablet TAKE 1 TABLET (10 MG TOTAL) BY MOUTH ONCE DAILY, at night    . estradiol (VIVELLE-DOT) 0.05 MG/24HR patch APPLY 1 PATCH ONTO SKIN TWICE A WEEK ON SUNDAY AND WEDNESDAY    . HYDROcodone-acetaminophen (NORCO/VICODIN) 5-325 MG tablet Take 1-2 tablets by mouth every 6 (six) hours as needed for  moderate pain. 20 tablet 0  . hydrocortisone 2.5 % ointment Apply topically 2 (two) times daily.    . hyoscyamine (LEVSIN SL) 0.125 MG SL tablet Place 1 tablet (0.125 mg total) under the tongue every 4 (four) hours as needed. 60 tablet 3  . ibuprofen (ADVIL,MOTRIN) 200 MG tablet Take 400 mg by mouth every 6 (six) hours as needed for fever.    . Multiple Vitamin (MULTI-VITAMINS) TABS Take by mouth.    . Nutritional Supplements (CANDIDA COMPLEX PO) Take 2 tablets by mouth daily.    Marland Kitchen omeprazole (PRILOSEC) 40 MG capsule Take 40 mg by mouth every 3 (three) days.    Marland Kitchen OVER THE COUNTER MEDICATION Take 2 tablets by mouth daily.    . Probiotic Product ([employer] PROBIOTIC MAXIMUM STRENGTH) CAPS Take 1 capsule by mouth daily.     . progesterone (PROMETRIUM) 200 MG capsule TAKE 200 MG EVERY DAY BY ORAL ROUTE FOR 90 DAYS.    Marland Kitchen ranitidine (ZANTAC) 300 MG tablet Take 300 mg by mouth at bedtime.  11  . thyroid (ARMOUR THYROID) 30 MG tablet Take 30 mg by mouth daily before breakfast.     . tretinoin (RETIN-A) 0.05 % cream Apply 1 application topically at bedtime as needed (for skin care).    . Vitamin D, Ergocalciferol, (DRISDOL) 50000 units CAPS capsule Take 50,000 Units by mouth every Wednesday.     . Zinc Sulfate (ZINC 15 PO) Take 1 application by mouth daily.    Marland Kitchen zolpidem (AMBIEN) 5 MG tablet Take 5 mg by mouth at bedtime as needed for sleep.      No current facility-administered medications for this visit.     Allergies  Allergen Reactions  . Kiwi Extract Anaphylaxis    Mouth and throat swelling and itching    Diagnoses:    ICD-10-CM   1. Major depressive disorder, recurrent episode, mild ([place]) F33.0      Part II to be continued at next session:   No. See next note   Lynnleigh Soden, [facility]

## 2018-05-16 ENCOUNTER — Encounter

## 2018-05-16 ENCOUNTER — Ambulatory Visit: Payer: BLUE CROSS/BLUE SHIELD | Admitting: Psychiatry

## 2018-05-16 ENCOUNTER — Encounter: Payer: Self-pay | Admitting: Psychiatry

## 2018-05-16 DIAGNOSIS — F330 Major depressive disorder, recurrent, mild: Secondary | ICD-10-CM

## 2018-05-16 NOTE — Progress Notes (Signed)
      [facility] Counselor/Therapist Progress Note  Patient ID: Olivia Armstrong, MRN: [id number],    Date: 05/16/2018  Time Spent: 51 minutes   Treatment Type: Individual Therapy  Reported Symptoms: Depressed mood  Mental Status Exam:  Appearance:   Casual and Well Groomed     Behavior:  Appropriate  Motor:  Normal  Speech/Language:   Clear and Coherent  Affect:  Appropriate  Mood:  depressed and sad  Thought process:  normal  Thought content:    WNL  Sensory/Perceptual disturbances:    WNL  Orientation:  oriented to person, place, time/date and situation  Attention:  Good  Concentration:  Good  Memory:  WNL  Fund of knowledge:   Good  Insight:    Good  Judgment:   Good  Impulse Control:  Good   Risk Assessment: Danger to Self:  No Self-injurious Behavior: No Danger to Others: No Duty to Warn:no Physical Aggression / Violence:No  Access to Firearms a concern: No  Gang Involvement:No   Subjective: The client states that she has been able to have a conversation with her sister.  She felt like it did not go as well as she would have liked.  She had told her sister that she did not want to spend time with both her and her boyfriend.  The client realized that this hurt her sister.  She will go back and restate that she really just wants one-on-one time with her sister.  The client thought that this was good. Today I used eye movement desensitization and reprocessing with the client around her fears of abandonment and rejection from friends and coworkers that leave.  The client realizes that she becomes very attached to people she works with and when they leave she gets very sad.  The client became quite tearful through this.  We discussed that the client has a great need for good community.  She tends to make that through her work relationships which then ultimately do not always work out well.  It is not because of conflict but because they move away. I asked the client to  consider what other activities she could do in her community that would help her build that social network that she wants.  The client plans to attend a local knitting class and she Tadan Shill also take some cooking lessons.  We discussed hosting regular get-togethers with friends that are still in the area as a way to keep connected.  The client realizes that she will have to initiate this because others will not necessarily do that.  Her subjective units of distress at the end of the session went from a 6+ to less than 1.  Interventions: Assertiveness/Communication, Solution-Oriented/Positive Psychology, Eye Movement Desensitization and Reprocessing (EMDR) and Insight-Oriented  Diagnosis:   ICD-10-CM   1. Major depressive disorder, recurrent episode, mild (HCC) F33.0     Plan: Assertiveness, boundaries, community events, social network.  Albertina Parr Tacy Chavis, [place]

## 2018-06-27 ENCOUNTER — Ambulatory Visit: Payer: BLUE CROSS/BLUE SHIELD | Admitting: Psychiatry

## 2018-06-27 ENCOUNTER — Encounter: Payer: Self-pay | Admitting: Psychiatry

## 2018-06-27 DIAGNOSIS — F330 Major depressive disorder, recurrent, mild: Secondary | ICD-10-CM | POA: Diagnosis not present

## 2018-06-27 NOTE — Progress Notes (Signed)
      [facility] Counselor/Therapist Progress Note  Patient ID: Olivia Armstrong, MRN: [id number],    Date: 06/27/2018  Time Spent: 52 minutes   Treatment Type: Individual Therapy  Reported Symptoms: Anxiety, sadness  Mental Status Exam:  Appearance:   Well Groomed     Behavior:  Appropriate  Motor:  Normal  Speech/Language:   Clear and Coherent  Affect:  Appropriate  Mood:  anxious and sad  Thought process:  normal  Thought content:    WNL  Sensory/Perceptual disturbances:    WNL  Orientation:  oriented to person, place, time/date and situation  Attention:  Good  Concentration:  Good  Memory:  WNL  Fund of knowledge:   Good  Insight:    Good  Judgment:   Good  Impulse Control:  Good   Risk Assessment: Danger to Self:  No Self-injurious Behavior: No Danger to Others: No Duty to Warn:no Physical Aggression / Violence:No  Access to Firearms a concern: No  Gang Involvement:No   Subjective: The client has focused on being less selfish where it concerns her sister.  She has worked harder on setting appropriate boundaries with family.  She finds that she gives in to do what others want and then is not able to take care of herself. Today we focused on assertive communication and what it means to be assertive versus aggressive.  The client will begin to look at being selfish as setting her own ego aside." She continues to assess her mood trying to increase her self-care and level of satisfaction in life.  The client will continue to look for other opportunities to engage socially outside of work and outside of her family.  Interventions: Assertiveness/Communication, Solution-Oriented/Positive Psychology and Insight-Oriented  Diagnosis:   ICD-10-CM   1. Major depressive disorder, recurrent episode, mild (HCC) F33.0     Plan: Social network, assertiveness, boundaries.  This record has been created using [employer].  Chart creation errors have been sought, but Olivia Armstrong not  always have been located and corrected. Such creation errors do not reflect on the standard of medical care.   Olivia Armstrong, [place]

## 2018-07-12 ENCOUNTER — Other Ambulatory Visit: Payer: Self-pay | Admitting: Internal Medicine

## 2018-07-12 DIAGNOSIS — R1011 Right upper quadrant pain: Secondary | ICD-10-CM

## 2018-08-08 ENCOUNTER — Ambulatory Visit: Payer: BLUE CROSS/BLUE SHIELD | Admitting: Psychiatry

## 2018-08-10 ENCOUNTER — Other Ambulatory Visit: Payer: Self-pay

## 2018-08-10 ENCOUNTER — Ambulatory Visit
Admission: RE | Admit: 2018-08-10 | Discharge: 2018-08-10 | Disposition: A | Discharge status: Home / Self Care | Payer: BLUE CROSS/BLUE SHIELD | Source: Ambulatory Visit | Attending: Internal Medicine | Admitting: Internal Medicine

## 2018-08-10 DIAGNOSIS — R1011 Right upper quadrant pain: Secondary | ICD-10-CM | POA: Diagnosis not present

## 2018-08-30 ENCOUNTER — Ambulatory Visit: Payer: Self-pay | Admitting: General Surgery

## 2018-08-30 NOTE — H&P (Signed)
PATIENT PROFILE: Olivia Armstrong is a 53 y.o. female who presents to the Clinic for consultation at the request of Dr. Doy Hutching for evaluation of cholelithiasis.  PCP:  Idelle Crouch, MD  HISTORY OF PRESENT ILLNESS: Olivia Armstrong reports having gallbladder attacks since few month ago.  She reports that she at least has had 3 of these gallbladder attacks.  She recalls to this year and one last year.  She describes attacks as severe right upper quadrant pain that radiates to her back.  The attacks has come an hour after eating and lasts for 30 minutes to 45 minutes.  She reports associated nausea and vomiting with the attacks.  She reports that the attacks are debilitating.  She denies any alleviating factors during the attack.  She cannot identify a specific type of food that provokes her attacks.  She denies fever chills.  She went to see her PCP for evaluation of the attacks and Dr. Doy Hutching ordered an ultrasound of the abdomen showing the stones in her bladder.  I personally evaluated the images and identified the stone in her gallbladder without any sign of gallbladder with thickening or edema.   PROBLEM LIST:         Problem List  Date Reviewed: 07/11/2018         Noted   Carpal tunnel syndrome, left 12/27/2017   Cervical spondylosis with radiculopathy 12/27/2017   Colon cancer (CMS-HCC) 10/06/2017   Abnormal CT scan Unknown   Menopausal symptoms 06/10/2016   Overview    prometrium '200mg'$  daily estrdiol patch 0.'05mg'$ /day q3days      S/P trigger finger release 10/27/2013   Palpitations 09/14/2013   Borderline hypothyroidism 09/14/2013   Overview    06/10/16 Sees integrative med with Dr. Sheryle Hail in [place] 42mg       Hyperlipidemia 09/14/2013   Anxiety and depression 09/14/2013      GENERAL REVIEW OF SYSTEMS:   General ROS: negative for - chills, fatigue, fever, weight gain or weight loss Allergy and Immunology ROS: negative for - hives   Hematological and Lymphatic ROS: negative for - bleeding problems or bruising, negative for palpable nodes Endocrine ROS: negative for - heat or cold intolerance, hair changes Respiratory ROS: negative for - cough, shortness of breath or wheezing Cardiovascular ROS: no chest pain or palpitations GI ROS: Positive for nausea, vomiting, abdominal pain, negative diarrhea, constipation Musculoskeletal ROS: negative for - joint swelling or muscle pain Neurological ROS: negative for - confusion, syncope Dermatological ROS: negative for pruritus and rash Psychiatric: negative for anxiety, depression, difficulty sleeping and memory loss  MEDICATIONS: CurrentMedications        Current Outpatient Medications  Medication Sig Dispense Refill  . ACZONE 7.5 % GlwP Apply 1 g topically 2 (two) times daily  3  . ergocalciferol, vitamin D2, 50,000 unit capsule Take 1 capsule (50,000 Units total) by mouth twice a week 8 capsule 11  . escitalopram oxalate (LEXAPRO) 10 MG tablet Take 1 tablet (10 mg total) by mouth once daily 90 tablet 1  . estradiol (VIVELLE-DOT) patch 0.05 mg/24 hr APPLY 1 PATCH TWICE A WEEK 24 patch 3  . lactobacillus comb no.10 (PROBIOTIC) 20 billion cell Cap Take by mouth.    . progesterone (PROMETRIUM) 200 MG capsule TAKE 1 CAPSULE (200 MG TOTAL) BY MOUTH ONCE DAILY 90 capsule 3  . thyroid (ARMOUR THYROID) 30 mg tablet Take 1 tablet (30 mg total) by mouth every morning before breakfast (0630) 90 tablet 3  .  tretinoin (RETIN-A) 0.05 % cream Apply 1 Application topically nightly    . zinc sulfate (ZINC-15 ORAL) Take 1 Application by mouth once daily    . zolpidem (AMBIEN) 5 MG tablet Take 1 tablet (5 mg total) by mouth nightly 30 tablet 5   No current facility-administered medications for this visit.       ALLERGIES: Kiwi (actinidia chinensis) and Remeron [mirtazapine]  PAST MEDICAL HISTORY:     Past Medical History:  Diagnosis Date  . Allergy 2015   Kiwi  .  Anxiety   . Anxiety and depression 09/14/2013  . Borderline abnormal thyroid function test   . Borderline hypothyroidism 09/14/2013   06/10/16 Sees integrative med with Dr. Sheryle Hail in [place] 72mg   . CAD (coronary artery disease)   . Colon cancer (CMS-HCC)   . Depression   . History of shortness of breath   . HPV (human papilloma virus) anogenital infection   . Hyperlipidemia   . Hypothyroidism   . Palpitations   . Tachycardia     PAST SURGICAL HISTORY:      Past Surgical History:  Procedure Laterality Date  . COLONOSCOPY    . COMBINED HYSTEROSCOPY DIAGNOSTIC / D&C  02/08/2017   Chelsea Ward  . DILATION AND CURETTAGE OF UTERUS  02/08/2017   [facility] . [facility]oscopic left carpal tunnel release. Left 01/05/2018   Dr. PRoland Rack . ENDOSCOPY BILE DUCT    . trigger thumb  2015     FAMILY HISTORY:      Family History  Problem Relation Age of Onset  . Thyroid disease Mother   . Colon polyps Mother   . Myocardial Infarction (Heart attack) Father   . Heart disease Father   . High blood pressure (Hypertension) Father        deceased 1105heart attack  . Colon cancer Sister   . Myocardial Infarction (Heart attack) Paternal Grandfather   . Colon cancer Sister        Nov 2008  . Colon polyps Sister      SOCIAL HISTORY: Social History          Socioeconomic History  . Marital status: Divorced    Spouse name: Not on file  . Number of children: 0  . Years of education: 134 . Highest education level: Not on file  Occupational History  . Occupation: AWeb designer Social Needs  . Financial resource strain: Not on file  . Food insecurity:    Worry: Not on file    Inability: Not on file  . Transportation needs:    Medical: Not on file    Non-medical: Not on file  Tobacco Use  . Smoking status: Former Smoker    Years: 5.00    Last attempt to quit: 04/20/1984    Years since quitting:  34.3  . Smokeless tobacco: Never Used  Substance and Sexual Activity  . Alcohol use: Yes    Alcohol/week: 1.0 standard drinks    Types: 1 Standard drinks or equivalent per week    Comment: Maybe 2 beers per/wk but not consistent  . Drug use: No  . Sexual activity: Not Currently    Partners: Male    Birth control/protection: Post-menopausal  Other Topics Concern  . Not on file  Social History Narrative  . Not on file      PHYSICAL EXAM:    Vitals:   08/30/18 1328  BP: 128/74  Pulse: 81   Body  mass index is 27.98 kg/m. Weight: 73.9 kg (163 lb)   GENERAL: Alert, active, oriented x3  HEENT: Pupils equal reactive to light. Extraocular movements are intact. Sclera clear. Palpebral conjunctiva normal red color.Pharynx clear.  NECK: Supple with no palpable mass and no adenopathy.  LUNGS: Sound clear with no rales rhonchi or wheezes.  HEART: Regular rhythm S1 and S2 without murmur.  ABDOMEN: Soft and depressible, nontender with no palpable mass, no hepatomegaly.   EXTREMITIES: Well-developed well-nourished symmetrical with no dependent edema.  NEUROLOGICAL: Awake alert oriented, facial expression symmetrical, moving all extremities.  REVIEW OF DATA: I have reviewed the following data today:      Initial consult on 08/30/2018  Component Date Value  . WBC (White Blood Cell Co* 08/30/2018 7.1   . RBC (Red Blood Cell Coun* 08/30/2018 4.31   . Hemoglobin 08/30/2018 12.9   . Hematocrit 08/30/2018 38.7   . MCV (Mean Corpuscular Vo* 08/30/2018 89.8   . MCH (Mean Corpuscular He* 08/30/2018 29.9   . MCHC (Mean Corpuscular H* 08/30/2018 33.3   . Platelet Count 08/30/2018 251   . RDW-CV (Red Cell Distrib* 08/30/2018 12.8   . MPV (Mean Platelet Volum* 08/30/2018 9.6   . Neutrophils 08/30/2018 3.83   . Lymphocytes 08/30/2018 2.44   . Monocytes 08/30/2018 0.69   . Eosinophils 08/30/2018 0.11   . Basophils 08/30/2018 0.05   . Neutrophil % 08/30/2018 53.8    . Lymphocyte % 08/30/2018 34.2   . Monocyte % 08/30/2018 9.7   . Eosinophil % 08/30/2018 1.5   . Basophil% 08/30/2018 0.7   . Immature Granulocyte % 08/30/2018 0.1   . Immature Granulocyte Cou* 08/30/2018 0.01   Appointment on 07/26/2018  Component Date Value  . Glucose 07/26/2018 100   . Sodium 07/26/2018 140   . Potassium 07/26/2018 4.6   . Chloride 07/26/2018 106   . Carbon Dioxide (CO2) 07/26/2018 29.6   . Urea Nitrogen (BUN) 07/26/2018 11   . Creatinine 07/26/2018 0.9   . Glomerular Filtration Ra* 07/26/2018 65   . Calcium 07/26/2018 8.7   . Albumin 07/26/2018 3.9   . Phosphorus 07/26/2018 3.6   Office Visit on 07/11/2018  Component Date Value  . Glucose 07/11/2018 120*  . Sodium 07/11/2018 138   . Potassium 07/11/2018 4.1   . Chloride 07/11/2018 97   . Carbon Dioxide (CO2) 07/11/2018 32.5*  . Urea Nitrogen (BUN) 07/11/2018 17   . Creatinine 07/11/2018 1.5*  . Glomerular Filtration Ra* 07/11/2018 36*  . Calcium 07/11/2018 9.5   . AST  07/11/2018 22   . ALT  07/11/2018 34   . Alk Phos (alkaline Phosp* 07/11/2018 92   . Albumin 07/11/2018 4.3   . Bilirubin, Total 07/11/2018 0.3   . Protein, Total 07/11/2018 7.1   . A/G Ratio 07/11/2018 1.5   . WBC (White Blood Cell Co* 07/11/2018 7.5   . RBC (Red Blood Cell Coun* 07/11/2018 4.77   . Hemoglobin 07/11/2018 14.5   . Hematocrit 07/11/2018 42.7   . MCV (Mean Corpuscular Vo* 07/11/2018 89.5   . MCH (Mean Corpuscular He* 07/11/2018 30.4   . MCHC (Mean Corpuscular H* 07/11/2018 34.0   . Platelet Count 07/11/2018 274   . RDW-CV (Red Cell Distrib* 07/11/2018 13.3   . MPV (Mean Platelet Volum* 07/11/2018 9.3*  . Neutrophils 07/11/2018 4.23   . Lymphocytes 07/11/2018 2.42   . Monocytes 07/11/2018 0.67   . Eosinophils 07/11/2018 0.15   . Basophils 07/11/2018 0.03   . Neutrophil % 07/11/2018  56.4   . Lymphocyte % 07/11/2018 32.2   . Monocyte % 07/11/2018 8.9   . Eosinophil % 07/11/2018 2.0   . Basophil% 07/11/2018 0.4    . Immature Granulocyte % 07/11/2018 0.1   . Immature Granulocyte Cou* 07/11/2018 0.01   . Thyroid Stimulating Horm* 07/11/2018 3.612   . Hemoglobin A1C 07/11/2018 6.2*  . Average Blood Glucose (C* 07/11/2018 131      ASSESSMENT: Ms. Val is a 53 y.o. female presenting for consultation for cholelithiasis.    Patient was oriented about the diagnosis of cholelithiasis. Also oriented about what is the gallbladder, its anatomy and function and the implications of having stones. The patient was oriented about the treatment alternatives (observation vs cholecystectomy). Patient was oriented that a low percentage of patient will continue to have similar pain symptoms even after the gallbladder is removed. Surgical technique (open vs laparoscopic) was discussed. It was also discussed the goals of the surgery (decrease the pain episodes and avoid the risk of cholecystitis) and the risk of surgery including: bleeding, infection, common bile duct injury, stone retention, injury to other organs such as bowel, liver, stomach, other complications such as hernia, bowel obstruction among others. Also discussed with patient about anesthesia and its complications such as: reaction to medications, pneumonia, heart complications, death, among others.  Patient oriented that she has been diagnosed with irritable bowel syndrome.  Even though I does consider that her 3 episode of severe abdominal pain in the right upper quadrant are due to stones in the gallbladder other GI symptoms like the diarrhea can be coming from the irritable bowel syndrome and that should not improve with the cholecystectomy.  I discussed with the patient that it can even make the diarrhea worse because of patients develop diarrhea after cholecystectomy.  She reports that she understand.  In that case she will continue care with her primary GI for management of IBS.  Calculus of gallbladder without cholecystitis without obstruction  [K80.20]  PLAN: 1. Laparoscopic cholecystectomy (90122) 2.  CBC CMP 3.  Avoid taking aspirin 5 days before surgery 4.  Recommend low-fat diet 5.  We will call you as soon as we know when we can perform your elective surgery and to give you further instructions regarding the preadmission process. 6.  Contact office has any question or concern.  Patient verbalized understanding, all questions were answered, and were agreeable with the plan outlined above.     Herbert Pun, MD  Electronically signed by Herbert Pun, MD

## 2018-09-19 ENCOUNTER — Ambulatory Visit (INDEPENDENT_AMBULATORY_CARE_PROVIDER_SITE_OTHER): Payer: BLUE CROSS/BLUE SHIELD | Admitting: Psychiatry

## 2018-09-19 ENCOUNTER — Other Ambulatory Visit: Payer: Self-pay

## 2018-09-19 ENCOUNTER — Encounter: Payer: Self-pay | Admitting: Psychiatry

## 2018-09-19 DIAGNOSIS — F330 Major depressive disorder, recurrent, mild: Secondary | ICD-10-CM

## 2018-09-19 NOTE — Progress Notes (Signed)
      [facility] Counselor/Therapist Progress Note  Patient ID: Olivia Armstrong, MRN: [id number],    Date: 09/19/2018  Time Spent: 51 minutes   Treatment Type: Individual Therapy  Reported Symptoms: anxiety, sadness  Mental Status Exam:  Appearance:   Well Groomed     Behavior:  Appropriate  Motor:  Normal  Speech/Language:   Clear and Coherent  Affect:  Appropriate  Mood:  anxious and sad  Thought process:  normal  Thought content:    WNL  Sensory/Perceptual disturbances:    WNL  Orientation:  oriented to person, place, time/date and situation  Attention:  Good  Concentration:  Good  Memory:  WNL  Fund of knowledge:   Good  Insight:    Good  Judgment:   Good  Impulse Control:  Good   Risk Assessment: Danger to Self:  No Self-injurious Behavior: No Danger to Others: No Duty to Warn:no Physical Aggression / Violence:No  Access to Firearms a concern: No  Gang Involvement:No   Subjective: I met with the client face-to-face.  We both had facemasks on. The client states that the lockdown has been difficult for her.  "I had some days of just being down."  Even though the client is an introvert she has missed the social interaction that the office offers.  She notes that there have been a number of changes at their office.  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  They were recently told they were not going to re-compete for a large contract they had been working on.  There was also a change in the management of the CEO.  Since that time 35 people have been laid off.  There seems to be more chaos at her work. The client states today that she has an interview tomorrow with a different company.  She hopes to be able to secure a new position in this company.  Today we used eye-movement around this interview and the client's lack of confidence.  Her negative cognition was, "I am not  confident."  She feels anxiety in her chest.  Her subjective units of distress was a 5.  As the client processed she realized that there was a lot that she knew.  Encouragement and engagement are important parts of her job.  When her supervisors have been engaged she has done very well.  When they are not she finds it harder to complete her work and to not procrastinate.  We discussed that the client has the skills to be self motivating and stay on top of things.  She agrees.  The relationship aspect of her job is important to her.  We discussed though that ultimately she needs to be the self motivate her.  She agrees.  Her positive cognition at the end of the session was, "I can motivate myself."  Interventions: Assertiveness/Communication, Motivational Interviewing, Solution-Oriented/Positive Psychology, [employer] Desensitization and Reprocessing (EMDR) and Insight-Oriented  Diagnosis:   ICD-10-CM   1. Major depressive disorder, recurrent episode, mild (HCC) F33.0     Plan: Positive self talk, exercise, boundaries, assertiveness.  This record has been created using [employer].  Chart creation errors have been sought, but Johnika Escareno not always have been located and corrected. Such creation errors do not reflect on the standard of medical care.  Kaelani Kendrick, [facility]

## 2018-10-04 ENCOUNTER — Encounter
Admission: RE | Admit: 2018-10-04 | Discharge: 2018-10-04 | Disposition: A | Discharge status: Home / Self Care | Payer: BC Managed Care – PPO | Source: Ambulatory Visit | Attending: General Surgery | Admitting: General Surgery

## 2018-10-04 ENCOUNTER — Other Ambulatory Visit: Payer: Self-pay

## 2018-10-04 DIAGNOSIS — Z1159 Encounter for screening for other viral diseases: Secondary | ICD-10-CM | POA: Diagnosis not present

## 2018-10-04 DIAGNOSIS — Z01818 Encounter for other preprocedural examination: Secondary | ICD-10-CM | POA: Insufficient documentation

## 2018-10-04 DIAGNOSIS — E785 Hyperlipidemia, unspecified: Secondary | ICD-10-CM | POA: Insufficient documentation

## 2018-10-04 DIAGNOSIS — I498 Other specified cardiac arrhythmias: Secondary | ICD-10-CM | POA: Insufficient documentation

## 2018-10-04 DIAGNOSIS — R002 Palpitations: Secondary | ICD-10-CM | POA: Diagnosis not present

## 2018-10-04 HISTORY — DX: Other specified postprocedural states: Z98.890

## 2018-10-04 HISTORY — DX: Other specified postprocedural states: R11.2

## 2018-10-04 NOTE — Patient Instructions (Signed)
Your procedure is scheduled on: Friday 10/07/18 Report to [facility]. To find out your arrival time please call [phone] between 1PM - 3PM on Thursday 10/06/18.  Remember: Instructions that are not followed completely may result in serious medical risk, up to and including death, or upon the discretion of your surgeon and anesthesiologist your surgery may need to be rescheduled.     _X__ 1. Do not eat food after midnight the night before your procedure.                 No gum chewing or hard candies. You may drink clear liquids up to 2 hours                 before you are scheduled to arrive for your surgery- DO not drink clear                 liquids within 2 hours of the start of your surgery.                 Clear Liquids include:  water, apple juice without pulp, clear carbohydrate                 drink such as Clearfast or Gatorade, Black Coffee or Tea (Do not add                 anything to coffee or tea).  __X__2.  On the morning of surgery brush your teeth with toothpaste and water, you                 may rinse your mouth with mouthwash if you wish.  Do not swallow any              toothpaste of mouthwash.     _X__ 3.  No Alcohol for 24 hours before or after surgery.   _X__ 4.  Do Not Smoke or use e-cigarettes For 24 Hours Prior to Your Surgery.                 Do not use any chewable tobacco products for at least 6 hours prior to                 surgery.  ____  5.  Bring all medications with you on the day of surgery if instructed.   __X__  6.  Notify your doctor if there is any change in your medical condition      (cold, fever, infections).     Do not wear jewelry, make-up, hairpins, clips or nail polish. Do not wear lotions, powders, or perfumes.  Do not shave 48 hours prior to surgery. Men may shave face and neck. Do not bring valuables to the hospital.    [facility] is not responsible for any belongings or  valuables.  Contacts, dentures/partials or body piercings may not be worn into surgery. Bring a case for your contacts, glasses or hearing aids, a denture cup will be supplied. Leave your suitcase in the car. After surgery it may be brought to your room. For patients admitted to the hospital, discharge time is determined by your treatment team.   Patients discharged the day of surgery will not be allowed to drive home.   Please read over the following fact sheets that you were given:   MRSA Information  __X__ Take these medicines the morning of surgery with A SIP OF WATER:  1. escitalopram (LEXAPRO)  2. thyroid (ARMOUR THYROID)   3.   4.  5.  6.  ____ Fleet Enema (as directed)   __X__ Use CHG Soap/SAGE wipes as directed  ____ Use inhalers on the day of surgery  ____ Stop metformin/Janumet/Farxiga 2 days prior to surgery    ____ Take 1/2 of usual insulin dose the night before surgery. No insulin the morning          of surgery.   ____ Stop Blood Thinners Coumadin/Plavix/Xarelto/Pleta/Pradaxa/Eliquis/Effient/Aspirin  on   Or contact your Surgeon, Cardiologist or Medical Doctor regarding  ability to stop your blood thinners  __X__ Stop Anti-inflammatories 7 days before surgery such as Advil, Ibuprofen, Motrin,  BC or Goodies Powder, Naprosyn, Naproxen, Aleve, Aspirin   May take OTC Tylenol or Acetaminophen  __X__ Stop all herbal supplements, fish oil or vitamin E until after surgery.    ____ Bring C-Pap to the hospital.

## 2018-10-05 LAB — NOVEL CORONAVIRUS, NAA (HOSP ORDER, SEND-OUT TO REF LAB; TAT 18-24 HRS): SARS-CoV-2, NAA: NOT DETECTED

## 2018-10-07 ENCOUNTER — Other Ambulatory Visit: Payer: Self-pay

## 2018-10-07 ENCOUNTER — Ambulatory Visit: Payer: BC Managed Care – PPO | Admitting: Certified Registered Nurse Anesthetist

## 2018-10-07 ENCOUNTER — Encounter
Admission: RE | Disposition: A | Discharge status: Home / Self Care | Payer: Self-pay | Source: Home / Self Care | Attending: General Surgery

## 2018-10-07 ENCOUNTER — Ambulatory Visit
Admission: RE | Admit: 2018-10-07 | Discharge: 2018-10-07 | Disposition: A | Discharge status: Home / Self Care | Payer: BC Managed Care – PPO | Attending: General Surgery | Admitting: General Surgery

## 2018-10-07 DIAGNOSIS — Z888 Allergy status to other drugs, medicaments and biological substances status: Secondary | ICD-10-CM | POA: Diagnosis not present

## 2018-10-07 DIAGNOSIS — K589 Irritable bowel syndrome without diarrhea: Secondary | ICD-10-CM | POA: Insufficient documentation

## 2018-10-07 DIAGNOSIS — Z8349 Family history of other endocrine, nutritional and metabolic diseases: Secondary | ICD-10-CM | POA: Insufficient documentation

## 2018-10-07 DIAGNOSIS — Z87891 Personal history of nicotine dependence: Secondary | ICD-10-CM | POA: Insufficient documentation

## 2018-10-07 DIAGNOSIS — R0602 Shortness of breath: Secondary | ICD-10-CM | POA: Diagnosis not present

## 2018-10-07 DIAGNOSIS — K219 Gastro-esophageal reflux disease without esophagitis: Secondary | ICD-10-CM | POA: Insufficient documentation

## 2018-10-07 DIAGNOSIS — E559 Vitamin D deficiency, unspecified: Secondary | ICD-10-CM | POA: Diagnosis not present

## 2018-10-07 DIAGNOSIS — I2510 Atherosclerotic heart disease of native coronary artery without angina pectoris: Secondary | ICD-10-CM | POA: Insufficient documentation

## 2018-10-07 DIAGNOSIS — Z85038 Personal history of other malignant neoplasm of large intestine: Secondary | ICD-10-CM | POA: Insufficient documentation

## 2018-10-07 DIAGNOSIS — Z79899 Other long term (current) drug therapy: Secondary | ICD-10-CM | POA: Insufficient documentation

## 2018-10-07 DIAGNOSIS — R002 Palpitations: Secondary | ICD-10-CM | POA: Diagnosis not present

## 2018-10-07 DIAGNOSIS — F419 Anxiety disorder, unspecified: Secondary | ICD-10-CM | POA: Diagnosis not present

## 2018-10-07 DIAGNOSIS — R000 Tachycardia, unspecified: Secondary | ICD-10-CM | POA: Insufficient documentation

## 2018-10-07 DIAGNOSIS — E785 Hyperlipidemia, unspecified: Secondary | ICD-10-CM | POA: Diagnosis not present

## 2018-10-07 DIAGNOSIS — Z8249 Family history of ischemic heart disease and other diseases of the circulatory system: Secondary | ICD-10-CM | POA: Insufficient documentation

## 2018-10-07 DIAGNOSIS — K828 Other specified diseases of gallbladder: Secondary | ICD-10-CM | POA: Insufficient documentation

## 2018-10-07 DIAGNOSIS — Z800 Family history of malignant neoplasm of digestive organs: Secondary | ICD-10-CM | POA: Diagnosis not present

## 2018-10-07 DIAGNOSIS — Z8371 Family history of colonic polyps: Secondary | ICD-10-CM | POA: Insufficient documentation

## 2018-10-07 DIAGNOSIS — K8010 Calculus of gallbladder with chronic cholecystitis without obstruction: Secondary | ICD-10-CM | POA: Insufficient documentation

## 2018-10-07 DIAGNOSIS — Z91018 Allergy to other foods: Secondary | ICD-10-CM | POA: Insufficient documentation

## 2018-10-07 DIAGNOSIS — F329 Major depressive disorder, single episode, unspecified: Secondary | ICD-10-CM | POA: Insufficient documentation

## 2018-10-07 DIAGNOSIS — E039 Hypothyroidism, unspecified: Secondary | ICD-10-CM | POA: Insufficient documentation

## 2018-10-07 HISTORY — PX: CHOLECYSTECTOMY: SHX55

## 2018-10-07 SURGERY — LAPAROSCOPIC CHOLECYSTECTOMY
Anesthesia: General

## 2018-10-07 MED ORDER — OXYCODONE HCL 5 MG/5ML PO SOLN: 5.0000 mg | Freq: Once | ORAL | Status: DC | PRN

## 2018-10-07 MED ORDER — FENTANYL CITRATE (PF) 100 MCG/2ML IJ SOLN
INTRAMUSCULAR | Status: DC | PRN
  Administered 2018-10-07: 100 ug via INTRAVENOUS

## 2018-10-07 MED ORDER — ROCURONIUM BROMIDE 100 MG/10ML IV SOLN
INTRAVENOUS | Status: DC | PRN
  Administered 2018-10-07: 50 mg via INTRAVENOUS

## 2018-10-07 MED ORDER — LACTATED RINGERS IV SOLN
INTRAVENOUS | Status: DC | PRN
  Administered 2018-10-07: 08:00:00 via INTRAVENOUS

## 2018-10-07 MED ORDER — BUPIVACAINE-EPINEPHRINE (PF) 0.5% -1:200000 IJ SOLN
INTRAMUSCULAR | Status: DC | PRN
  Administered 2018-10-07: 15 mL

## 2018-10-07 MED ORDER — FENTANYL CITRATE (PF) 100 MCG/2ML IJ SOLN
25.0000 ug | INTRAMUSCULAR | Status: DC | PRN
  Administered 2018-10-07 (×2): 25 ug via INTRAVENOUS

## 2018-10-07 MED ORDER — SODIUM CHLORIDE FLUSH 0.9 % IV SOLN
INTRAVENOUS | Status: AC
  Filled 2018-10-07: qty 40

## 2018-10-07 MED ORDER — BUPIVACAINE-EPINEPHRINE (PF) 0.5% -1:200000 IJ SOLN
INTRAMUSCULAR | Status: AC
  Filled 2018-10-07: qty 30

## 2018-10-07 MED ORDER — FAMOTIDINE 20 MG PO TABS
ORAL_TABLET | ORAL | Status: AC
  Administered 2018-10-07: 20 mg via ORAL
  Filled 2018-10-07: qty 1

## 2018-10-07 MED ORDER — PROPOFOL 10 MG/ML IV BOLUS
INTRAVENOUS | Status: AC
  Filled 2018-10-07: qty 20

## 2018-10-07 MED ORDER — CEFAZOLIN SODIUM-DEXTROSE 2-4 GM/100ML-% IV SOLN
2.0000 g | INTRAVENOUS | Status: AC
  Administered 2018-10-07: 2 g via INTRAVENOUS

## 2018-10-07 MED ORDER — FENTANYL CITRATE (PF) 100 MCG/2ML IJ SOLN
INTRAMUSCULAR | Status: AC
  Filled 2018-10-07: qty 2

## 2018-10-07 MED ORDER — HYDROCODONE-ACETAMINOPHEN 5-325 MG PO TABS: 1.0000 | ORAL_TABLET | ORAL | 0 refills | Status: AC | PRN

## 2018-10-07 MED ORDER — LACTATED RINGERS IV SOLN
INTRAVENOUS | Status: DC
  Administered 2018-10-07: 07:00:00 via INTRAVENOUS

## 2018-10-07 MED ORDER — SUGAMMADEX SODIUM 200 MG/2ML IV SOLN
INTRAVENOUS | Status: AC
  Filled 2018-10-07: qty 4

## 2018-10-07 MED ORDER — PROPOFOL 500 MG/50ML IV EMUL
INTRAVENOUS | Status: DC | PRN
  Administered 2018-10-07: 120 ug/kg/min via INTRAVENOUS

## 2018-10-07 MED ORDER — OXYCODONE HCL 5 MG PO TABS: 5.0000 mg | ORAL_TABLET | Freq: Once | ORAL | Status: DC | PRN

## 2018-10-07 MED ORDER — GLYCOPYRROLATE 0.2 MG/ML IJ SOLN
INTRAMUSCULAR | Status: DC | PRN
  Administered 2018-10-07: 0.2 mg via INTRAVENOUS

## 2018-10-07 MED ORDER — PROMETHAZINE HCL 25 MG/ML IJ SOLN
INTRAMUSCULAR | Status: AC
  Administered 2018-10-07: 09:00:00 6.25 mg via INTRAVENOUS
  Filled 2018-10-07: qty 1

## 2018-10-07 MED ORDER — LIDOCAINE HCL (CARDIAC) PF 100 MG/5ML IV SOSY
PREFILLED_SYRINGE | INTRAVENOUS | Status: DC | PRN
  Administered 2018-10-07: 100 mg via INTRAVENOUS

## 2018-10-07 MED ORDER — PROMETHAZINE HCL 25 MG/ML IJ SOLN
6.2500 mg | Freq: Four times a day (QID) | INTRAMUSCULAR | Status: AC | PRN
  Administered 2018-10-07: 6.25 mg via INTRAVENOUS

## 2018-10-07 MED ORDER — KETOROLAC TROMETHAMINE 30 MG/ML IJ SOLN
INTRAMUSCULAR | Status: DC | PRN
  Administered 2018-10-07: 30 mg via INTRAVENOUS

## 2018-10-07 MED ORDER — PROPOFOL 10 MG/ML IV BOLUS
INTRAVENOUS | Status: DC | PRN
  Administered 2018-10-07: 160 mg via INTRAVENOUS

## 2018-10-07 MED ORDER — KETAMINE HCL 50 MG/ML IJ SOLN
INTRAMUSCULAR | Status: DC | PRN
  Administered 2018-10-07: 50 mg via INTRAMUSCULAR

## 2018-10-07 MED ORDER — MIDAZOLAM HCL 2 MG/2ML IJ SOLN
INTRAMUSCULAR | Status: DC | PRN
  Administered 2018-10-07: 2 mg via INTRAVENOUS

## 2018-10-07 MED ORDER — BUPIVACAINE HCL (PF) 0.5 % IJ SOLN
INTRAMUSCULAR | Status: AC
  Filled 2018-10-07: qty 30

## 2018-10-07 MED ORDER — ONDANSETRON HCL 4 MG/2ML IJ SOLN
INTRAMUSCULAR | Status: DC | PRN
  Administered 2018-10-07: 4 mg via INTRAVENOUS

## 2018-10-07 MED ORDER — MIDAZOLAM HCL 2 MG/2ML IJ SOLN
INTRAMUSCULAR | Status: AC
  Filled 2018-10-07: qty 2

## 2018-10-07 MED ORDER — BUPIVACAINE-EPINEPHRINE (PF) 0.25% -1:200000 IJ SOLN
INTRAMUSCULAR | Status: AC
  Filled 2018-10-07: qty 30

## 2018-10-07 MED ORDER — SODIUM CHLORIDE FLUSH 0.9 % IV SOLN
INTRAVENOUS | Status: AC
  Filled 2018-10-07: qty 10

## 2018-10-07 MED ORDER — BUPIVACAINE LIPOSOME 1.3 % IJ SUSP
INTRAMUSCULAR | Status: AC
  Filled 2018-10-07: qty 20

## 2018-10-07 MED ORDER — KETAMINE HCL 50 MG/ML IJ SOLN
INTRAMUSCULAR | Status: AC
  Filled 2018-10-07: qty 10

## 2018-10-07 MED ORDER — SUGAMMADEX SODIUM 200 MG/2ML IV SOLN
INTRAVENOUS | Status: DC | PRN
  Administered 2018-10-07: 200 mg via INTRAVENOUS
  Administered 2018-10-07: 150 mg via INTRAVENOUS

## 2018-10-07 MED ORDER — CEFAZOLIN SODIUM-DEXTROSE 2-4 GM/100ML-% IV SOLN
INTRAVENOUS | Status: AC
  Filled 2018-10-07: qty 100

## 2018-10-07 MED ORDER — FAMOTIDINE 20 MG PO TABS
20.0000 mg | ORAL_TABLET | Freq: Once | ORAL | Status: AC
  Administered 2018-10-07: 06:00:00 20 mg via ORAL

## 2018-10-07 MED ORDER — DEXMEDETOMIDINE HCL IN NACL 200 MCG/50ML IV SOLN
INTRAVENOUS | Status: AC
  Filled 2018-10-07: qty 50

## 2018-10-07 MED ORDER — DEXAMETHASONE SODIUM PHOSPHATE 10 MG/ML IJ SOLN
INTRAMUSCULAR | Status: DC | PRN
  Administered 2018-10-07: 6 mg via INTRAVENOUS

## 2018-10-07 SURGICAL SUPPLY — 37 items
APPLIER CLIP 5 13 M/L LIGAMAX5 (MISCELLANEOUS) ×3
BLADE SURG SZ11 CARB STEEL (BLADE) ×3 IMPLANT
CANISTER SUCT 1200ML W/VALVE (MISCELLANEOUS) ×3 IMPLANT
CATH CHOLANG 76X19 KUMAR (CATHETERS) IMPLANT
CHLORAPREP W/TINT 26 (MISCELLANEOUS) ×3 IMPLANT
CLIP APPLIE 5 13 M/L LIGAMAX5 (MISCELLANEOUS) ×1 IMPLANT
COVER WAND RF STERILE (DRAPES) ×3 IMPLANT
DERMABOND ADVANCED (GAUZE/BANDAGES/DRESSINGS) ×2
DERMABOND ADVANCED .7 DNX12 (GAUZE/BANDAGES/DRESSINGS) ×1 IMPLANT
ELECT REM PT RETURN 9FT ADLT (ELECTROSURGICAL) ×3
ELECTRODE REM PT RTRN 9FT ADLT (ELECTROSURGICAL) ×1 IMPLANT
GLOVE BIO SURGEON STRL SZ 6.5 (GLOVE) ×6 IMPLANT
GLOVE BIO SURGEONS STRL SZ 6.5 (GLOVE) ×3
GLOVE INDICATOR 6.5 STRL GRN (GLOVE) ×9 IMPLANT
GOWN STRL REUS W/ TWL LRG LVL3 (GOWN DISPOSABLE) ×3 IMPLANT
GOWN STRL REUS W/TWL LRG LVL3 (GOWN DISPOSABLE) ×6
GRASPER SUT TROCAR 14GX15 (MISCELLANEOUS) IMPLANT
HEMOSTAT SURGICEL 2X3 (HEMOSTASIS) IMPLANT
IRRIGATION STRYKERFLOW (MISCELLANEOUS) ×1 IMPLANT
IRRIGATOR STRYKERFLOW (MISCELLANEOUS) ×3
IV NS 1000ML (IV SOLUTION) ×2
IV NS 1000ML BAXH (IV SOLUTION) ×1 IMPLANT
KIT TURNOVER KIT A (KITS) ×3 IMPLANT
LABEL OR SOLS (LABEL) ×3 IMPLANT
NEEDLE HYPO 25X1 1.5 SAFETY (NEEDLE) ×3 IMPLANT
NEEDLE INSUFFLATION 14GA 120MM (NEEDLE) ×3 IMPLANT
NS IRRIG 500ML POUR BTL (IV SOLUTION) ×3 IMPLANT
PACK LAP CHOLECYSTECTOMY (MISCELLANEOUS) ×3 IMPLANT
POUCH SPECIMEN RETRIEVAL 10MM (ENDOMECHANICALS) ×3 IMPLANT
SCISSORS METZENBAUM CVD 33 (INSTRUMENTS) ×3 IMPLANT
SET TUBE SMOKE EVAC HIGH FLOW (TUBING) ×3 IMPLANT
SLEEVE ENDOPATH XCEL 5M (ENDOMECHANICALS) ×6 IMPLANT
SUT MNCRL AB 4-0 PS2 18 (SUTURE) ×3 IMPLANT
SUT VIC AB 0 CT1 36 (SUTURE) IMPLANT
SUT VICRYL 0 AB UR-6 (SUTURE) ×3 IMPLANT
TROCAR XCEL NON-BLD 11X100MML (ENDOMECHANICALS) ×3 IMPLANT
TROCAR XCEL NON-BLD 5MMX100MML (ENDOMECHANICALS) ×3 IMPLANT

## 2018-10-07 NOTE — Anesthesia Postprocedure Evaluation (Signed)
Anesthesia Post Note  Patient: Olivia Armstrong  Procedure(s) Performed: LAPAROSCOPIC CHOLECYSTECTOMY (N/A )  Patient location during evaluation: PACU Anesthesia Type: General Level of consciousness: awake and alert Pain management: pain level controlled Vital Signs Assessment: post-procedure vital signs reviewed and stable Respiratory status: spontaneous breathing, nonlabored ventilation, respiratory function stable and patient connected to nasal cannula oxygen Cardiovascular status: blood pressure returned to baseline and stable Postop Assessment: no apparent nausea or vomiting Anesthetic complications: no     Last Vitals:  Vitals:   10/07/18 1033 10/07/18 1208  BP: 132/70 127/72  Pulse: 69 60  Resp: 16 16  Temp: 36.6 C   SpO2: 97% 100%    Last Pain:  Vitals:   10/07/18 1208  TempSrc:   PainSc: 3                  Precious Haws Piscitello

## 2018-10-07 NOTE — Anesthesia Preprocedure Evaluation (Signed)
Anesthesia Evaluation  Patient identified by MRN, date of birth, ID band Patient awake    Reviewed: Allergy & Precautions, H&P , NPO status , Patient's Chart, lab work & pertinent test results  History of Anesthesia Complications (+) PONV and history of anesthetic complications  Airway Mallampati: II  TM Distance: >3 FB Neck ROM: full    Dental  (+) Chipped   Pulmonary neg pulmonary ROS, neg shortness of breath,           Cardiovascular Exercise Tolerance: Good + dysrhythmias      Neuro/Psych PSYCHIATRIC DISORDERS negative neurological ROS     GI/Hepatic Neg liver ROS, GERD  Medicated and Controlled,  Endo/Other  Hypothyroidism Hyperthyroidism   Renal/GU      Musculoskeletal   Abdominal   Peds  Hematology negative hematology ROS (+)   Anesthesia Other Findings Past Medical History: 09/14/2013: Anxiety and depression 07/30/2016: Depression No date: Dysrhythmia     Comment:  palpitations.  no treatment. saw dr. Marcelline Deist and had               stress test but no diagnosis No date: GERD (gastroesophageal reflux disease)     Comment:  winding down on antacids 09/14/2013: Hyperlipidemia, unspecified No date: Hyperthyroidism 06/10/2016: Menopausal symptoms     Comment:  Overview:  prometrium 200mg  daily estrdiol patch               0.05mg /day q3days 09/14/2013: Palpitations No date: PONV (postoperative nausea and vomiting) 10/27/2013: S/P trigger finger release No date: Vitamin D deficiency  Past Surgical History: 01/05/2018: CARPAL TUNNEL RELEASE; Left     Comment:  Procedure: CARPAL TUNNEL RELEASE ENDOSCOPIC;  Surgeon:               Corky Mull, MD;  Location: [facility];                Service: Orthopedics;  Laterality: Left; 09/2012: COLONOSCOPY 10/15/2017: COLONOSCOPY WITH PROPOFOL; N/A     Comment:  Procedure: COLONOSCOPY WITH PROPOFOL;  Surgeon: Lucilla Lame, MD;  Location: [facility];  Service:               Endoscopy;  Laterality: N/A; 10/15/2017: ESOPHAGOGASTRODUODENOSCOPY (EGD) WITH PROPOFOL; N/A     Comment:  Procedure: ESOPHAGOGASTRODUODENOSCOPY (EGD) WITH               PROPOFOL;  Surgeon: Lucilla Lame, MD;  Location: [place];  Service: Endoscopy;  Laterality: N/A; 02/05/2017: HYSTEROSCOPY W/D&C; N/A     Comment:  Procedure: DILATATION AND CURETTAGE /HYSTEROSCOPY;                Surgeon: Ward, Honor Loh, MD;  Location: [facility];                Service: Gynecology;  Laterality: N/A; 10/15/2017: POLYPECTOMY; N/A     Comment:  Procedure: POLYPECTOMY;  Surgeon: Lucilla Lame, MD;                Location: [facility];  Service: Endoscopy;                Laterality: N/A; 2015: TRIGGER FINGER RELEASE; Left     Comment:  thumb, under local  BMI    Body Mass Index: 28.34 kg/m      Reproductive/Obstetrics negative OB ROS  Anesthesia Physical Anesthesia Plan  ASA: III  Anesthesia Plan: General ETT   Post-op Pain Management:    Induction: Intravenous  PONV Risk Score and Plan: Ondansetron, Dexamethasone, Midazolam, Treatment may vary due to age or medical condition, Propofol infusion and TIVA  Airway Management Planned: Oral ETT  Additional Equipment:   Intra-op Plan:   Post-operative Plan: Extubation in OR  Informed Consent: I have reviewed the patients History and Physical, chart, labs and discussed the procedure including the risks, benefits and alternatives for the proposed anesthesia with the patient or authorized representative who has indicated his/her understanding and acceptance.     Dental Advisory Given  Plan Discussed with: Anesthesiologist, CRNA and Surgeon  Anesthesia Plan Comments: (Patient consented for risks of anesthesia including but not limited to:  - adverse reactions to medications - damage to teeth, lips or other oral mucosa -  sore throat or hoarseness - Damage to heart, brain, lungs or loss of life  Patient voiced understanding.)        Anesthesia Quick Evaluation

## 2018-10-07 NOTE — Anesthesia Procedure Notes (Signed)
Procedure Name: Intubation Date/Time: 10/07/2018 7:42 AM Performed by: Babs Sciara, CRNA Pre-anesthesia Checklist: Patient identified, Emergency Drugs available, Suction available, Patient being monitored and Timeout performed Patient Re-evaluated:Patient Re-evaluated prior to induction Oxygen Delivery Method: Circle system utilized Preoxygenation: Pre-oxygenation with 100% oxygen Induction Type: IV induction Ventilation: Mask ventilation without difficulty Laryngoscope Size: Mac, McGraph and 3 (2 DL attempts) Grade View: Grade I Tube type: Oral Tube size: 7.0 mm Number of attempts: 2 (could not advance ETT on first attempt -- not relaxed? Easily passed on 2nd look) Airway Equipment and Method: Stylet and Video-laryngoscopy Placement Confirmation: positive ETCO2 and breath sounds checked- equal and bilateral Secured at: 21 (lip) cm Tube secured with: Tape Dental Injury: Teeth and Oropharynx as per pre-operative assessment

## 2018-10-07 NOTE — Transfer of Care (Signed)
Immediate Anesthesia Transfer of Care Note  Patient: Olivia Armstrong  Procedure(s) Performed: LAPAROSCOPIC CHOLECYSTECTOMY (N/A )  Patient Location: PACU  Anesthesia Type:General  Level of Consciousness: sedated  Airway & Oxygen Therapy: Patient Spontanous Breathing and Patient connected to face mask oxygen  Post-op Assessment: Report given to RN and Post -op Vital signs reviewed and stable  Post vital signs: Reviewed and stable  Last Vitals:  Vitals Value Taken Time  BP 116/96 10/07/18 0859  Temp 36.3 C 10/07/18 0859  Pulse 81 10/07/18 0902  Resp 22 10/07/18 0902  SpO2 100 % 10/07/18 0902  Vitals shown include unvalidated device data.  Last Pain:  Vitals:   10/07/18 0637  TempSrc:   PainSc: 0-No pain         Complications: No apparent anesthesia complications

## 2018-10-07 NOTE — Anesthesia Procedure Notes (Signed)
Procedure Name: Intubation Date/Time: 10/07/2018 7:42 AM Performed by: Babs Sciara, CRNA Pre-anesthesia Checklist: Patient identified, Emergency Drugs available, Suction available, Patient being monitored and Timeout performed Patient Re-evaluated:Patient Re-evaluated prior to induction Oxygen Delivery Method: Circle system utilized Preoxygenation: Pre-oxygenation with 100% oxygen Induction Type: IV induction Ventilation: Mask ventilation without difficulty Laryngoscope Size: Mac and 3 (Grade 1 view but could not advance ETT--not relaxed /) Grade View: Grade I Tube type: Oral Tube size: 7.0 mm Number of attempts: 2 (DL x2; 2nd attempt with mcgrath) Airway Equipment and Method: Stylet and Video-laryngoscopy Placement Confirmation: positive ETCO2 and breath sounds checked- equal and bilateral Secured at: 21 (lip) cm

## 2018-10-07 NOTE — Op Note (Signed)
Preoperative diagnosis: Symptomatic cholelithiasis  Postoperative diagnosis: Symptomatic cholelithiasis.  Procedure: Laparoscopic Cholecystectomy.   Anesthesia: GETA   Surgeon: Dr. Windell Moment  Wound Classification: Clean Contaminated  Indications: Patient is a 53 y.o. female developed multiple episodes of right upper quadrant pain and nausea and on workup was found to have cholelithiasis with a normal common duct. Laparoscopic cholecystectomy was elected.  Findings: Critical view of safety achieved Cystic duct and artery identified, ligated and divided Adequate hemostasis  Description of procedure: The patient was placed on the operating table in the supine position. General anesthesia was induced. A time-out was completed verifying correct patient, procedure, site, positioning, and implant(s) and/or special equipment prior to beginning this procedure. An orogastric tube was placed. The abdomen was prepped and draped in the usual sterile fashion.  An incision was made in a natural skin line above the umbilicus.  The fascia was elevated and the Veress needle inserted. Proper position was confirmed by aspiration and saline meniscus test.  The abdomen was insufflated with carbon dioxide to a pressure of 15 mmHg. The patient tolerated insufflation well. A 11-mm trocar was then inserted.  The laparoscope was inserted and the abdomen inspected. No injuries from initial trocar placement were noted. Additional trocars were then inserted in the following locations: a 5-mm trocar in the right epigastrium and two 5-mm trocars along the right costal margin. The abdomen was inspected and no abnormalities were found. The table was placed in the reverse Trendelenburg position with the right side up.  Filmy adhesions between the gallbladder and omentum, duodenum and transverse colon were lysed sharply. The dome of the gallbladder was grasped with an atraumatic grasper passed through the lateral port and  retracted over the dome of the liver. The infundibulum was also grasped with an atraumatic grasper through the midclavicular port and retracted toward the right lower quadrant. This maneuver exposed Calot's triangle. The peritoneum overlying the gallbladder infundibulum was then incised and the cystic duct and cystic artery identified and circumferentially dissected. Critical view of safety reviewed before ligating any structure. The cystic duct and cystic artery were then doubly clipped and divided close to the gallbladder.  The gallbladder was then dissected from its peritoneal attachments by electrocautery. Hemostasis was checked and the gallbladder and contained stones were removed using an endoscopic retrieval bag placed through the umbilical port. The gallbladder was passed off the table as a specimen. The gallbladder fossa was copiously irrigated with saline and hemostasis was obtained. There was no evidence of bleeding from the gallbladder fossa or cystic artery or leakage of the bile from the cystic duct stump. Secondary trocars were removed under direct vision. No bleeding was noted. The laparoscope was withdrawn and the umbilical trocar removed. The abdomen was allowed to collapse. The fascia of the 82mm trocar sites was closed with figure-of-eight 0 vicryl sutures. The skin was closed with subcuticular sutures of 4-0 monocryl and topical skin adhesive. The orogastric tube was removed.  The patient tolerated the procedure well and was taken to the postanesthesia care unit in stable condition.   Specimen: Gallbladder  Complications: None  EBL: 5 mL

## 2018-10-07 NOTE — Discharge Instructions (Signed)
  Diet: Resume home heart healthy regular diet.   Activity: No heavy lifting >20 pounds (children, pets, laundry, garbage) or strenuous activity until follow-up, but light activity and walking are encouraged. Do not drive or drink alcohol if taking narcotic pain medications.  Wound care: May shower with soapy water and pat dry (do not rub incisions), but no baths or submerging incision underwater until follow-up. (no swimming)   Medications: Resume all home medications. For mild to moderate pain: acetaminophen (Tylenol) or ibuprofen (if no kidney disease). Combining Tylenol with alcohol can substantially increase your risk of causing liver disease. Narcotic pain medications, if prescribed, can be used for severe pain, though may cause nausea, constipation, and drowsiness. Do not combine Tylenol and Norco within a 6 hour period as Norco contains Tylenol. If you do not need the narcotic pain medication, you do not need to fill the prescription.  Call office ([phone]) at any time if any questions, worsening pain, fevers/chills, bleeding, drainage from incision site, or other concerns.   AMBULATORY SURGERY  DISCHARGE INSTRUCTIONS   1) The drugs that you were given will stay in your system until tomorrow so for the next 24 hours you should not:  A) Drive an automobile B) Make any legal decisions C) Drink any alcoholic beverage   2) You may resume regular meals tomorrow.  Today it is better to start with liquids and gradually work up to solid foods.  You may eat anything you prefer, but it is better to start with liquids, then soup and crackers, and gradually work up to solid foods.   3) Please notify your doctor immediately if you have any unusual bleeding, trouble breathing, redness and pain at the surgery site, drainage, fever, or pain not relieved by medication.    4) Additional Instructions:        Please contact your physician with any problems or Same Day Surgery at  [phone], Monday through Friday 6 am to 4 pm, or [facility] Main number at [phone]. 

## 2018-10-07 NOTE — H&P (Signed)
PATIENT PROFILE: Olivia Armstrong is a 53 y.o. female who to the hospital today for cholecystectomy. Patient previously evaluated in the office a month ago. There has been no changes on history or physical exam since last evaluation.   PCP: Idelle Crouch, MD  HISTORY OF PRESENT ILLNESS: Olivia Armstrong reports having gallbladder attacks since few month ago. She reports that she at least has had 3 of these gallbladder attacks. She recalls to this year and one last year. She describes attacks as severe right upper quadrant pain that radiates to her back. The attacks has come an hour after eating and lasts for 30 minutes to 45 minutes. She reports associated nausea and vomiting with the attacks. She reports that the attacks are debilitating. She denies any alleviating factors during the attack. She cannot identify a specific type of food that provokes her attacks. She denies fever chills. She went to see her PCP for evaluation of the attacks and Dr. Doy Hutching ordered an ultrasound of the abdomen showing the stones in her bladder. I personally evaluated the images and identified the stone in her gallbladder without any sign of gallbladder with thickening or edema.  PROBLEM LIST: Problem List Date Reviewed: 07/11/2018  Noted  Carpal tunnel syndrome, left 12/27/2017  Cervical spondylosis with radiculopathy 12/27/2017  Colon cancer (CMS-HCC) 10/06/2017  Abnormal CT scan Unknown  Menopausal symptoms 06/10/2016  Overview  prometrium 248m daily estrdiol patch 0.019mday q3days   S/P trigger finger release 10/27/2013  Palpitations 09/14/2013  Borderline hypothyroidism 09/14/2013  Overview  06/10/16 [place] integrative med [place]   Hyperlipidemia 09/14/2013  Anxiety and depression 09/14/2013    GENERAL REVIEW OF SYSTEMS:   General ROS: negative for - chills, fatigue, fever, weight gain or weight loss Allergy and Immunology ROS: negative for - hives   Hematological and Lymphatic ROS: negative for - bleeding problems or bruising, negative for palpable nodes Endocrine ROS: negative for - heat or cold intolerance, hair changes Respiratory ROS: negative for - cough, shortness of breath or wheezing Cardiovascular ROS: no chest pain or palpitations GI ROS: Positive for nausea, vomiting, abdominal pain, negative diarrhea, constipation Musculoskeletal ROS: negative for - joint swelling or muscle pain Neurological ROS: negative for - confusion, syncope Dermatological ROS: negative for pruritus and rash Psychiatric: negative for anxiety, depression, difficulty sleeping and memory loss  MEDICATIONS: Current Outpatient Medications  Medication Sig Dispense Refill  . ACZONE 7.5 % GlwP Apply 1 g topically 2 (two) times daily 3  . ergocalciferol, vitamin D2, 50,000 unit capsule Take 1 capsule (50,000 Units total) by mouth twice a week 8 capsule 11  . escitalopram oxalate (LEXAPRO) 10 MG tablet Take 1 tablet (10 mg total) by mouth once daily 90 tablet 1  . estradiol (VIVELLE-DOT) patch 0.05 mg/24 hr APPLY 1 PATCH TWICE A WEEK 24 patch 3  . lactobacillus comb no.10 (PROBIOTIC) 20 billion cell Cap Take by mouth.  . progesterone (PROMETRIUM) 200 MG capsule TAKE 1 CAPSULE (200 MG TOTAL) BY MOUTH ONCE DAILY 90 capsule 3  . thyroid (ARMOUR THYROID) 30 mg tablet Take 1 tablet (30 mg total) by mouth every morning before breakfast (0630) 90 tablet 3  . tretinoin (RETIN-A) 0.05 % cream Apply 1 Application topically nightly  . zinc sulfate (ZINC-15 ORAL) Take 1 Application by mouth once daily  . zolpidem (AMBIEN) 5 MG tablet Take 1 tablet (5 mg total) by mouth nightly 30 tablet 5   No current facility-administered medications for  this visit.   ALLERGIES: Kiwi (actinidia chinensis) and Remeron [mirtazapine]  PAST MEDICAL HISTORY: Past Medical History:  Diagnosis Date  . Allergy 2015  Kiwi  . Anxiety  . Anxiety and depression 09/14/2013  . Borderline  abnormal thyroid function test  . Borderline hypothyroidism 09/14/2013  06/10/16 Sees integrative med with Dr. Sheryle Hail in [place] 66mg  . CAD (coronary artery disease)  . Colon cancer (CMS-HCC)  . Depression  . History of shortness of breath  . HPV (human papilloma virus) anogenital infection  . Hyperlipidemia  . Hypothyroidism  . Palpitations  . Tachycardia   PAST SURGICAL HISTORY: Past Surgical History:  Procedure Laterality Date  . COLONOSCOPY  . COMBINED HYSTEROSCOPY DIAGNOSTIC / D&C 02/08/2017  [facility]  . DILATION AND [facility]URETT[facility] OF UTERUS [facility]08[facility]2017  [facility] . Endoscopic left carpal tunnel release. Left 01/05/2018  Dr. PRoland Rack . ENDOSCOPY BILE DUCT  . trigger thumb 2015    FAMILY HISTORY: Family History  Problem Relation Age of Onset  . Thyroid disease Mother  . Colon polyps Mother  . Myocardial Infarction (Heart attack) Father  . Heart disease Father  . High blood pressure (Hypertension) Father  deceased 14heart attack  . Colon cancer Sister  . Myocardial Infarction (Heart attack) Paternal Grandfather  . Colon cancer Sister  Nov 2008  . Colon polyps Sister    SOCIAL HISTORY: Social History   Socioeconomic History  . Marital status: Divorced  Spouse name: Not on file  . Number of children: 0  . Years of education: 137 . Highest education level: Not on file  Occupational History  . Occupation: AWeb designer Social Needs  . Financial resource strain: Not on file  . Food insecurity:  Worry: Not on file  Inability: Not on file  . Transportation needs:  Medical: Not on file  Non-medical: Not on file  Tobacco Use  . Smoking status: Former Smoker  Years: 5.00  Last attempt to quit: 04/20/1984  Years since quitting: 34.3  . Smokeless tobacco: Never Used  Substance and Sexual Activity  . Alcohol use: Yes  Alcohol/week: 1.0 standard drinks  Types: 1 Standard drinks or equivalent per week  Comment: Maybe 2  beers per/wk but not consistent  . Drug use: No  . Sexual activity: Not Currently  Partners: Male  Birth control/protection: Post-menopausal  Other Topics Concern  . Not on file  Social History Narrative  . Not on file   PHYSICAL EXAM: Vitals:  08/30/18 1328  BP: 128/74  Pulse: 81   Body mass index is 27.98 kg/m. Weight: 73.9 kg (163 lb)   GENERAL: Alert, active, oriented x3  HEENT: Pupils equal reactive to light. Extraocular movements are intact. Sclera clear. Palpebral conjunctiva normal red color.Pharynx clear.  NECK: Supple with no palpable mass and no adenopathy.  LUNGS: Sound clear with no rales rhonchi or wheezes.  HEART: Regular rhythm S1 and S2 without murmur.  ABDOMEN: Soft and depressible, nontender with no palpable mass, no hepatomegaly.   EXTREMITIES: Well-developed well-nourished symmetrical with no dependent edema.  NEUROLOGICAL: Awake alert oriented, facial expression symmetrical, moving all extremities.  REVIEW OF DATA: I have reviewed the following data today: Initial consult on 08/30/2018  Component Date Value  . WBC (White Blood Cell Co* 08/30/2018 7.1  . RBC (Red Blood Cell Coun* 08/30/2018 4.31  . Hemoglobin 08/30/2018 12.9  . Hematocrit 08/30/2018 38.7  . MCV (Mean Corpuscular Vo* 08/30/2018 89.8  . MCH (Mean  Corpuscular He* 08/30/2018 29.9  . MCHC (Mean Corpuscular H* 08/30/2018 33.3  . Platelet Count 08/30/2018 251  . RDW-CV (Red Cell Distrib* 08/30/2018 12.8  . MPV (Mean Platelet Volum* 08/30/2018 9.6  . Neutrophils 08/30/2018 3.83  . Lymphocytes 08/30/2018 2.44  . Monocytes 08/30/2018 0.69  . Eosinophils 08/30/2018 0.11  . Basophils 08/30/2018 0.05  . Neutrophil % 08/30/2018 53.8  . Lymphocyte % 08/30/2018 34.2  . Monocyte % 08/30/2018 9.7  . Eosinophil % 08/30/2018 1.5  . Basophil% 08/30/2018 0.7  . Immature Granulocyte % 08/30/2018 0.1  . Immature Granulocyte Cou* 08/30/2018 0.01  Appointment on 07/26/2018  Component Date  Value  . Glucose 07/26/2018 100  . Sodium 07/26/2018 140  . Potassium 07/26/2018 4.6  . Chloride 07/26/2018 106  . Carbon Dioxide (CO2) 07/26/2018 29.6  . Urea Nitrogen (BUN) 07/26/2018 11  . Creatinine 07/26/2018 0.9  . Glomerular Filtration Ra* 07/26/2018 65  . Calcium 07/26/2018 8.7  . Albumin 07/26/2018 3.9  . Phosphorus 07/26/2018 3.6  Office Visit on 07/11/2018  Component Date Value  . Glucose 07/11/2018 120*  . Sodium 07/11/2018 138  . Potassium 07/11/2018 4.1  . Chloride 07/11/2018 97  . Carbon Dioxide (CO2) 07/11/2018 32.5*  . Urea Nitrogen (BUN) 07/11/2018 17  . Creatinine 07/11/2018 1.5*  . Glomerular Filtration Ra* 07/11/2018 36*  . Calcium 07/11/2018 9.5  . AST 07/11/2018 22  . ALT 07/11/2018 34  . Alk Phos (alkaline Phosp* 07/11/2018 92  . Albumin 07/11/2018 4.3  . Bilirubin, Total 07/11/2018 0.3  . Protein, Total 07/11/2018 7.1  . A/G Ratio 07/11/2018 1.5  . WBC (White Blood Cell Co* 07/11/2018 7.5  . RBC (Red Blood Cell Coun* 07/11/2018 4.77  . Hemoglobin 07/11/2018 14.5  . Hematocrit 07/11/2018 42.7  . MCV (Mean Corpuscular Vo* 07/11/2018 89.5  . MCH (Mean Corpuscular He* 07/11/2018 30.4  . MCHC (Mean Corpuscular H* 07/11/2018 34.0  . Platelet Count 07/11/2018 274  . RDW-CV (Red Cell Distrib* 07/11/2018 13.3  . MPV (Mean Platelet Volum* 07/11/2018 9.3*  . Neutrophils 07/11/2018 4.23  . Lymphocytes 07/11/2018 2.42  . Monocytes 07/11/2018 0.67  . Eosinophils 07/11/2018 0.15  . Basophils 07/11/2018 0.03  . Neutrophil % 07/11/2018 56.4  . Lymphocyte % 07/11/2018 32.2  . Monocyte % 07/11/2018 8.9  . Eosinophil % 07/11/2018 2.0  . Basophil% 07/11/2018 0.4  . Immature Granulocyte % 07/11/2018 0.1  . Immature Granulocyte Cou* 07/11/2018 0.01  . Thyroid Stimulating Horm* 07/11/2018 3.612  . Hemoglobin A1C 07/11/2018 6.2*  . Average Blood Glucose (C* 07/11/2018 131    ASSESSMENT: Ms. Messerschmidt is a 53 y.o. female presenting for consultation for  cholelithiasis.   Patient was oriented about the diagnosis of cholelithiasis. Also oriented about what is the gallbladder, its anatomy and function and the implications of having stones. The patient was oriented about the treatment alternatives (observation vs cholecystectomy). Patient was oriented that a low percentage of patient will continue to have similar pain symptoms even after the gallbladder is removed. Surgical technique (open vs laparoscopic) was discussed. It was also discussed the goals of the surgery (decrease the pain episodes and avoid the risk of cholecystitis) and the risk of surgery including: bleeding, infection, common bile duct injury, stone retention, injury to other organs such as bowel, liver, stomach, other complications such as hernia, bowel obstruction among others. Also discussed with patient about anesthesia and its complications such as: reaction to medications, pneumonia, heart complications, death, among others.   Patient oriented that she has been diagnosed  with irritable bowel syndrome. Even though I does consider that her 3 episode of severe abdominal pain in the right upper quadrant are due to stones in the gallbladder other GI symptoms like the diarrhea can be coming from the irritable bowel syndrome and that should not improve with the cholecystectomy. I discussed with the patient that it can even make the diarrhea worse because of patients develop diarrhea after cholecystectomy. She reports that she understand. In that case she will continue care with her primary GI for management of IBS.  Calculus of gallbladder without cholecystitis without obstruction [K80.20]  PLAN: 1. Laparoscopic cholecystectomy (82505) 2. CBC CMP (done) 3. Avoid taking aspirin 5 days before surgery 4. Recommend low-fat diet  Patient verbalized understanding, all questions were answered, and were agreeable with the plan outlined above.   Herbert Pun, MD

## 2018-10-07 NOTE — Anesthesia Post-op Follow-up Note (Signed)
Anesthesia QCDR form completed.        

## 2018-10-10 LAB — SURGICAL PATHOLOGY

## 2018-10-28 ENCOUNTER — Other Ambulatory Visit: Payer: Self-pay | Admitting: Obstetrics & Gynecology

## 2018-10-28 DIAGNOSIS — Z1231 Encounter for screening mammogram for malignant neoplasm of breast: Secondary | ICD-10-CM

## 2018-10-31 ENCOUNTER — Other Ambulatory Visit: Payer: Self-pay

## 2018-10-31 ENCOUNTER — Encounter: Payer: Self-pay | Admitting: Psychiatry

## 2018-10-31 ENCOUNTER — Ambulatory Visit (INDEPENDENT_AMBULATORY_CARE_PROVIDER_SITE_OTHER): Payer: BC Managed Care – PPO | Admitting: Psychiatry

## 2018-10-31 DIAGNOSIS — F330 Major depressive disorder, recurrent, mild: Secondary | ICD-10-CM

## 2018-10-31 NOTE — Progress Notes (Signed)
      [facility] Counselor/Therapist Progress Note  Patient ID: Olivia Armstrong, MRN: [id number],    Date: 10/31/2018  Time Spent: 56 minutes   Treatment Type: Individual Therapy  Reported Symptoms: anxiety, sadness.  Mental Status Exam:  Appearance:   Casual     Behavior:  Appropriate  Motor:  Normal  Speech/Language:   Clear and Coherent  Affect:  Appropriate  Mood:  anxious and sad  Thought process:  normal  Thought content:    WNL  Sensory/Perceptual disturbances:    WNL  Orientation:  oriented to person, place, time/date and situation  Attention:  Good  Concentration:  Good  Memory:  WNL  Fund of knowledge:   Good  Insight:    Good  Judgment:   Good  Impulse Control:  Good   Risk Assessment: Danger to Self:  No Self-injurious Behavior: No Danger to Others: No Duty to Warn:no Physical Aggression / Violence:No  Access to Firearms a concern: No  Gang Involvement:No   Subjective: The client states that she did not get the job that she interviewed for.  The feedback she received stated that they felt she could do this job but they did not feel she had the confidence to do so.  The client admits that the feedback is valid.  "I do have self-doubt." Today I used eye-movement and the bilateral stimulation hand paddles with the client to process through her self-doubt.  I asked the client where she learned this.  She felt it came out of her middle school experience its.  She also believes that if she makes a mistake that she has failed.  Then it will be devastating and terrible.  I challenged the client with the idea that mistakes are opportunity to learn and add to your fund of knowledge.  The client agreed. I also discussed with the client that she does not listen to her intuition.  She admits that when she does it is usually right.  I agreed and told her she ignores her into wishing she would do that at her peril.  I asked the client to pay more attention to this and try to  trust her own judgment. Her biggest fear is what other people think of her.  This has never served her well and I asked the client to begin to act out this at this.  She said she will try.  Interventions: Assertiveness/Communication, Mindfulness Meditation, Motivational Interviewing, Solution-Oriented/Positive Psychology, [employer] Group Desensitization and Reprocessing (EMDR) and Insight-Oriented  Diagnosis:   ICD-10-CM   1. Major depressive disorder, recurrent episode, mild (HCC)  F33.0     Plan: Assertiveness, boundaries, positive self talk, acting opposite to negative thoughts.  This record has been created using [employer].  Chart creation errors have been sought, but Adaisha Campise not always have been located and corrected. Such creation errors do not reflect on the standard of medical care.  Elek Holderness, [facility]

## 2018-11-16 ENCOUNTER — Ambulatory Visit
Admission: RE | Admit: 2018-11-16 | Discharge: 2018-11-16 | Disposition: A | Discharge status: Home / Self Care | Payer: BC Managed Care – PPO | Source: Ambulatory Visit | Attending: Obstetrics & Gynecology | Admitting: Obstetrics & Gynecology

## 2018-11-16 ENCOUNTER — Other Ambulatory Visit: Payer: Self-pay

## 2018-11-16 DIAGNOSIS — Z1231 Encounter for screening mammogram for malignant neoplasm of breast: Secondary | ICD-10-CM | POA: Insufficient documentation

## 2018-12-12 ENCOUNTER — Encounter: Payer: Self-pay | Admitting: Psychiatry

## 2018-12-12 ENCOUNTER — Ambulatory Visit (INDEPENDENT_AMBULATORY_CARE_PROVIDER_SITE_OTHER): Payer: BC Managed Care – PPO | Admitting: Psychiatry

## 2018-12-12 ENCOUNTER — Other Ambulatory Visit: Payer: Self-pay

## 2018-12-12 DIAGNOSIS — F330 Major depressive disorder, recurrent, mild: Secondary | ICD-10-CM | POA: Diagnosis not present

## 2018-12-12 NOTE — Progress Notes (Signed)
      [facility] Counselor/Therapist Progress Note  Patient ID: Olivia Armstrong, MRN: [id number],    Date: 12/12/2018  Time Spent: 57 minutes   Treatment Type: Individual Therapy  Reported Symptoms: anxious, sad.  Mental Status Exam:  Appearance:   Casual     Behavior:  Appropriate  Motor:  Normal  Speech/Language:   Clear and Coherent  Affect:  Appropriate  Mood:  anxious and sad  Thought process:  normal  Thought content:    WNL  Sensory/Perceptual disturbances:    WNL  Orientation:  oriented to person, place and time/date  Attention:  Good  Concentration:  Good  Memory:  WNL  Fund of knowledge:   Good  Insight:    Good  Judgment:   Good  Impulse Control:  Good   Risk Assessment: Danger to Self:  No Self-injurious Behavior: No Danger to Others: No Duty to Warn:no Physical Aggression / Violence:No  Access to Firearms a concern: No  Gang Involvement:No   Subjective: At last session the client had worked on increasing her confidence.  She had applied for a job at [employer] and not received it.  The feedback was that she did not seem sure of herself.  We used EMDR to help the client increase her level of confidence.  The client then went out and applied for a new job with a different company.  She was comfortable with the position she was applying for.  The process move very quickly and before she knew it they had hired her.  When she was asked how much she needed for Salary she told them $80,000.  This is a $15,000 increase from her previous salary is $65,000. The client has been anxious about the expectations in the new job.  She is also been sad about ending her previous job.  Today the client talked through the whole process.  I pointed out to the client that she was able to use her assertiveness skills with her increased confidence.  The client had not really thought of it in those terms but was clearly encouraged. We discussed what the client needed to do to begin to  take care of herself more effectively.  She identified exercise and self-care as well as increasing her social network.  Interventions: Assertiveness/Communication, Motivational Interviewing, Solution-Oriented/Positive Psychology and Insight-Oriented  Diagnosis:   ICD-10-CM   1. Major depressive disorder, recurrent episode, mild (HCC)  F33.0     Plan: Assertiveness, boundaries, exercise, social network.  Kasheena Sambrano, [facility]

## 2019-01-09 ENCOUNTER — Ambulatory Visit (INDEPENDENT_AMBULATORY_CARE_PROVIDER_SITE_OTHER): Payer: 59 | Admitting: Psychiatry

## 2019-01-09 ENCOUNTER — Encounter: Payer: Self-pay | Admitting: Psychiatry

## 2019-01-09 ENCOUNTER — Other Ambulatory Visit: Payer: Self-pay

## 2019-01-09 DIAGNOSIS — F330 Major depressive disorder, recurrent, mild: Secondary | ICD-10-CM | POA: Diagnosis not present

## 2019-01-09 NOTE — Progress Notes (Signed)
      [facility] Counselor/Therapist Progress Note  Patient ID: Olivia Armstrong, MRN: [id number],    Date: 01/09/2019  Time Spent: 50 minutes   Treatment Type: Individual Therapy  Reported Symptoms: anxious, irritated.  Mental Status Exam:  Appearance:   Casual     Behavior:  Appropriate  Motor:  Normal  Speech/Language:   Clear and Coherent  Affect:  Appropriate  Mood:  anxious and irritable  Thought process:  normal  Thought content:    WNL  Sensory/Perceptual disturbances:    WNL  Orientation:  oriented to person, place, time/date and situation  Attention:  Good  Concentration:  Good  Memory:  WNL  Fund of knowledge:   Good  Insight:    Good  Judgment:   Good  Impulse Control:  Good   Risk Assessment: Danger to Self:  No Self-injurious Behavior: No Danger to Others: No Duty to Warn:no Physical Aggression / Violence:No  Access to Firearms a concern: No  Gang Involvement:No   Subjective: The client states that she is adjusting to the chaos at work.  "Their processes are not well developed."  Since the client works remotely they have meetings via [employer].  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  During one of their video meetings his microphone was unmuted causing a lot of feedback.  The client sent a message pointing that out.  The supervisor then got very angry and told her she was, "brash".  He told her to quit being so corrective.  The client was shocked since her intention is always to be kind and appropriate with everyone she interacts with.  His sharpness with her surprised her.  After the meeting she contacted her main supervisor and related what had happened.  The female supervisor was supposed to call her but never did.  The main supervisor coordinated a three-way call.  Her immediate supervisor then apologized to her and they moved on. The client was proud of herself that she set the boundaries that she needed to set.  She was also  assertive and just did not take it on the chin as she would have in the past.  We discussed how to handle these kinds of things going forward.  This supervisor apparently acknowledges that he can have a temper.  We discussed professional but direct ways of addressing inappropriate behavior.  The client feels like she can do this. I used eye-movement with the client to reinforce the positive belief, "I was not out of bounds."  Her subjective units of distress was less than 2 at the end of the session.  It had started at a 6.  Interventions: Assertiveness/Communication, Motivational Interviewing, Solution-Oriented/Positive Psychology, [employer] Group Desensitization and Reprocessing (EMDR) and Insight-Oriented  Diagnosis:   ICD-10-CM   1. Major depressive disorder, recurrent episode, mild (HCC)  F33.0     Plan: Boundaries, assertiveness, self-care, exercise, mood independent behavior.  Damyra Luscher, [facility]

## 2019-01-30 ENCOUNTER — Encounter: Payer: Self-pay | Admitting: Psychiatry

## 2019-01-30 ENCOUNTER — Ambulatory Visit (INDEPENDENT_AMBULATORY_CARE_PROVIDER_SITE_OTHER): Payer: 59 | Admitting: Psychiatry

## 2019-01-30 ENCOUNTER — Other Ambulatory Visit: Payer: Self-pay

## 2019-01-30 DIAGNOSIS — F330 Major depressive disorder, recurrent, mild: Secondary | ICD-10-CM | POA: Diagnosis not present

## 2019-01-30 NOTE — Progress Notes (Signed)
      [facility] Counselor/Therapist Progress Note  Patient ID: SERENITY SCHNORR, MRN: [id number],    Date: 01/30/2019  Time Spent: 50 minutes   Treatment Type: Individual Therapy  Reported Symptoms: anxious, irritable  Mental Status Exam:  Appearance:   Casual     Behavior:  Appropriate  Motor:  Normal  Speech/Language:   Clear and Coherent  Affect:  Appropriate  Mood:  anxious and irritable  Thought process:  normal  Thought content:    WNL  Sensory/Perceptual disturbances:    WNL  Orientation:  oriented to person, place, time/date and situation  Attention:  Good  Concentration:  Good  Memory:  WNL  Fund of knowledge:   Good  Insight:    Good  Judgment:   Good  Impulse Control:  Good   Risk Assessment: Danger to Self:  No Self-injurious Behavior: No Danger to Others: No Duty to Warn:no Physical Aggression / Violence:No  Access to Firearms a concern: No  Gang Involvement:No   Subjective: The client reports that the supervisor she had conflict with has moved on to another position.  The new supervisor is someone she has worked with closely and has good report.  She states that there is a lot of chaos at her company.  They are in the process of nailing down processes and standard operating procedures.  The new data manager is a previous employee of the company the client used to work for.  She has a working relationship with this person as well which has helped.  The chaos and uncertainty at work had increased the client's anxiety but also triggered an irritability and frustration at how things were going. The client states that she will give this new job one year and then reevaluate.  Currently she is continuing to exercise and keep good sleep hygiene.  She notes that since she works from home it is easier for her to integrate exercise into the dailiness of her life. We discussed boundaries and assertiveness at work.  The client has done a good job at standing up for  herself and defending the decisions that she is made.  She will continue to stay with positive self talk.  She will follow-up in 6 weeks.  Interventions: Assertiveness/Communication, Motivational Interviewing, Solution-Oriented/Positive Psychology, [employer] Group Desensitization and Reprocessing (EMDR) and Insight-Oriented  Diagnosis:   ICD-10-CM   1. Major depressive disorder, recurrent episode, mild (HCC)  F33.0     Plan: Positive self talk, exercise, self-care, sleep hygiene, boundaries, assertiveness.  Keyonta Madrid, [facility]

## 2019-02-18 ENCOUNTER — Ambulatory Visit: Admit: 2019-02-18 | Payer: BLUE CROSS/BLUE SHIELD | Admitting: General Surgery

## 2019-02-18 SURGERY — LAPAROSCOPIC CHOLECYSTECTOMY
Anesthesia: General

## 2019-03-09 ENCOUNTER — Ambulatory Visit: Payer: 59 | Admitting: Psychiatry

## 2019-04-03 ENCOUNTER — Ambulatory Visit (INDEPENDENT_AMBULATORY_CARE_PROVIDER_SITE_OTHER): Payer: 59 | Admitting: Psychiatry

## 2019-04-03 ENCOUNTER — Other Ambulatory Visit: Payer: Self-pay

## 2019-04-03 ENCOUNTER — Encounter: Payer: Self-pay | Admitting: Psychiatry

## 2019-04-03 DIAGNOSIS — F4321 Adjustment disorder with depressed mood: Secondary | ICD-10-CM

## 2019-04-03 NOTE — Progress Notes (Signed)
      [facility] Counselor/Therapist Progress Note  Patient ID: Olivia Armstrong, MRN: [id number],    Date: 04/03/2019  Time Spent: 50 minutes   Treatment Type: Individual Therapy  Reported Symptoms: sadness, frustration  Mental Status Exam:  Appearance:   Well Groomed     Behavior:  Appropriate  Motor:  Normal  Speech/Language:   Clear and Coherent  Affect:  Appropriate  Mood:  irritable and sad  Thought process:  normal  Thought content:    WNL  Sensory/Perceptual disturbances:    WNL  Orientation:  oriented to person, place, time/date and situation  Attention:  Good  Concentration:  Good  Memory:  WNL  Fund of knowledge:   Good  Insight:    Good  Judgment:   Good  Impulse Control:  Good   Risk Assessment: Danger to Self:  No Self-injurious Behavior: No Danger to Others: No Duty to Warn:no Physical Aggression / Violence:No  Access to Firearms a concern: No  Gang Involvement:No   Subjective: Her older sisters father passed away unexpectedly of the COVID-19.  The client explained that this man originally abandoned the family.  The client's father remarried her mother and adopted the two older girls.  She states her 2nd oldest sister is still angry with her dad who has now passed away.  Through this event the client has discovered that there is still some residual anger with other members of the family.  We discussed what would be the appropriate boundaries for her.  She decided to step back and let things play out without adding to the conversation.  "It's really not my place." The client talked about her own expectations not being met with people, especially at work.  She was trying to work through how to come to terms with this.  "Should I expect less?"  I discussed with her that it is appropriate to express what she wants and if agreed on, it should happen.  I asked the client if she communicates her expectations to others?  She realized she did not.  The client had an  epiphany and is decided to communicate her expectations more clearly.  Interventions: Assertiveness/Communication, Motivational Interviewing, Solution-Oriented/Positive Psychology and Insight-Oriented  Diagnosis:   ICD-10-CM   1. Adjustment disorder with depressed mood  F43.21     Plan: Assertiveness, boundaries, anger model, acceptance, self-care.  Kaushal Vannice, [facility]

## 2019-07-03 ENCOUNTER — Encounter: Payer: Self-pay | Admitting: Psychiatry

## 2019-07-03 ENCOUNTER — Other Ambulatory Visit: Payer: Self-pay

## 2019-07-03 ENCOUNTER — Ambulatory Visit (INDEPENDENT_AMBULATORY_CARE_PROVIDER_SITE_OTHER): Payer: 59 | Admitting: Psychiatry

## 2019-07-03 DIAGNOSIS — F4321 Adjustment disorder with depressed mood: Secondary | ICD-10-CM

## 2019-07-03 NOTE — Progress Notes (Signed)
      [facility] Counselor/Therapist Progress Note  Patient ID: Olivia Armstrong, MRN: [id number],    Date: 07/03/2019  Time Spent: 50 minutes   Treatment Type: Individual Therapy  Reported Symptoms: sad, irritable  Mental Status Exam:  Appearance:   Well Groomed     Behavior:  Appropriate  Motor:  Normal  Speech/Language:   Clear and Coherent  Affect:  Appropriate  Mood:  irritable and sad  Thought process:  normal  Thought content:    WNL  Sensory/Perceptual disturbances:    WNL  Orientation:  oriented to person, place, time/date and situation  Attention:  Good  Concentration:  Good  Memory:  WNL  Fund of knowledge:   Good  Insight:    Good  Judgment:   Good  Impulse Control:  Good   Risk Assessment: Danger to Self:  No Self-injurious Behavior: No Danger to Others: No Duty to Warn:no Physical Aggression / Violence:No  Access to Firearms a concern: No  Gang Involvement:No   Subjective: The client states that she has made a lot of adjustments concerning her sister.  This has calmed her mood down and made her more accepting.  An ex-boyfriend had contacted her via text messaging.  She had wanted to go a full year and not have to talk to him.  He recently contacted her but she quickly set the boundaries.  Client stated that she is recently began having dreams about her ex-boyfriend.  "I want things gone."  Today we used the bilateral stimulation hand paddles.  The client visualized herself on the beach with Jesus.  She saw a cord that connected her to the ex-boyfriend.  Jesus cut the cord and she was free.  Behind her was a large chest that was open.  She poured all of her memories about the ex-boyfriend into the chest.  Jesus then threw it into the sun.  The client stated that she was free and felt much relief.  Her subjective units of distress went from a 6+ to less than 2.  Her positive cognition was, "I am free."  Interventions: Assertiveness/Communication, Motivational  Interviewing, Solution-Oriented/Positive Psychology, [employer] Desensitization and Reprocessing (EMDR) and Insight-Oriented  Diagnosis:   ICD-10-CM   1. Adjustment disorder with depressed mood  F43.21     Plan: Mood independent behavior, assertiveness, boundaries, positive self talk, self-care.  Tegan Britain, [facility]

## 2019-08-09 ENCOUNTER — Other Ambulatory Visit: Payer: Self-pay | Admitting: Internal Medicine

## 2019-08-09 DIAGNOSIS — Z1231 Encounter for screening mammogram for malignant neoplasm of breast: Secondary | ICD-10-CM

## 2019-08-18 DIAGNOSIS — D122 Benign neoplasm of ascending colon: Secondary | ICD-10-CM

## 2019-10-02 ENCOUNTER — Other Ambulatory Visit: Payer: Self-pay

## 2019-10-02 ENCOUNTER — Ambulatory Visit (INDEPENDENT_AMBULATORY_CARE_PROVIDER_SITE_OTHER): Payer: 59 | Admitting: Psychiatry

## 2019-10-02 ENCOUNTER — Encounter: Payer: Self-pay | Admitting: Psychiatry

## 2019-10-02 DIAGNOSIS — F4321 Adjustment disorder with depressed mood: Secondary | ICD-10-CM | POA: Diagnosis not present

## 2019-10-02 NOTE — Progress Notes (Signed)
      [facility] Counselor/Therapist Progress Note  Patient ID: Olivia Armstrong, MRN: [id number],    Date: 10/02/2019  Time Spent: 50 minutes   Treatment Type: Individual Therapy  Reported Symptoms: sad  Mental Status Exam:  Appearance:   Casual     Behavior:  Appropriate  Motor:  Normal  Speech/Language:   Clear and Coherent  Affect:  Appropriate  Mood:  sad  Thought process:  normal  Thought content:    WNL  Sensory/Perceptual disturbances:    WNL  Orientation:  oriented to person, place, time/date and situation  Attention:  Good  Concentration:  Good  Memory:  WNL  Fund of knowledge:   Good  Insight:    Good  Judgment:   Good  Impulse Control:  Good   Risk Assessment: Danger to Self:  No Self-injurious Behavior: No Danger to Others: No Duty to Warn:no Physical Aggression / Violence:No  Access to Firearms a concern: No  Gang Involvement:No   Subjective: The client had an issue with her doctor's new medical assistant.  "I have a history with her."  The medical assistant is the daughter-in-law of the ex-boyfriend of the client.  The client was able to talk to her physician and set boundaries with him.  It caused her some sadness remembering how things did not work out with her ex-boyfriend.  It also made her uncomfortable because the medical assistant, is not fond of me". The client is also concerned about a conflict in her friend group between 2 men.  She states that it is made things awkward when they all get together.  She is afraid of one of the men's anger.  We discussed what her response should be.  I suggested that she not respond to his anger at all.  The client should also not be forced to choose sides.  I encouraged her to make that very clear.  She agreed.  She can also just walk away. The client's old employer has made overtures for the client to come back.  At the client's current job she recently got a significant increase in her salary.  She had told her  self that she would give this job 1 year.  In August she will reevaluate if she would return back to her previous place of employment.  The client continues to care for herself.  She works remotely and as a result gets lonely for her friends and family.  She is making more of an effort now that [place] restrictions have been reduced to interact more.  Interventions: Assertiveness/Communication, Motivational Interviewing, Solution-Oriented/Positive Psychology and Insight-Oriented  Diagnosis:   ICD-10-CM   1. Adjustment disorder with depressed mood  F43.21     Plan: Boundaries, assertiveness, speak less listen more, positive self talk, social engagement, hobbies.  Jacody Beneke, [facility]

## 2019-12-04 ENCOUNTER — Other Ambulatory Visit: Payer: Self-pay

## 2019-12-04 ENCOUNTER — Ambulatory Visit (INDEPENDENT_AMBULATORY_CARE_PROVIDER_SITE_OTHER): Payer: 59 | Admitting: Psychiatry

## 2019-12-04 ENCOUNTER — Encounter: Payer: Self-pay | Admitting: Psychiatry

## 2019-12-04 DIAGNOSIS — F4321 Adjustment disorder with depressed mood: Secondary | ICD-10-CM

## 2019-12-04 NOTE — Progress Notes (Signed)
      [facility] Counselor/Therapist Progress Note  Patient ID: TAMBI THOLE, MRN: [id number],    Date: 12/04/2019  Time Spent: 50 minutes   Treatment Type: Individual Therapy  Reported Symptoms: sadness  Mental Status Exam:  Appearance:   Well Groomed     Behavior:  Appropriate  Motor:  Normal  Speech/Language:   Clear and Coherent  Affect:  Appropriate  Mood:  sad  Thought process:  normal  Thought content:    WNL  Sensory/Perceptual disturbances:    WNL  Orientation:  oriented to person, place, time/date and situation  Attention:  Good  Concentration:  Good  Memory:  WNL  Fund of knowledge:   Good  Insight:    Good  Judgment:   Good  Impulse Control:  Good   Risk Assessment: Danger to Self:  No Self-injurious Behavior: No Danger to Others: No Duty to Warn:no Physical Aggression / Violence:No  Access to Firearms a concern: No  Gang Involvement:No   Subjective: The client states that she is sad and disappointed that she cannot buy the house that she wants.  She does not have enough money for a down payment for the cost of the house that she wants.  We discussed what other options the client might have but she does not want to go into any more that than she has to.  She also wants to be able to keep some of the financial cushion that she has in savings.  We talked about having some radical acceptance about the circumstance as she work towards saving more of a down payment. Client also discussed issues with that she had with her manager at work.  "It is hard to communicate with her."  The client feels that she does not always understand exactly what her manager is trying to communicate.  We discussed using reflective listening with the manager to tease out what the meaning is.  The client agrees that she needs to do this.  We discussed this at length and the client agreed that she needs more patience and fortitude going forward.  She will continue with her positive self  talk.  Her social network has been a very positive support for her.  Interventions: Assertiveness/Communication, Mindfulness Meditation, Motivational Interviewing, Solution-Oriented/Positive Psychology and Insight-Oriented  Diagnosis:   ICD-10-CM   1. Adjustment disorder with depressed mood  F43.21     Plan: Patient's, fortitude, reflective listening, radical acceptance, positive self talk, self-care, assertiveness, boundaries, social network.  Adelaide Pfefferkorn, [facility], [facility]

## 2020-01-29 ENCOUNTER — Ambulatory Visit (INDEPENDENT_AMBULATORY_CARE_PROVIDER_SITE_OTHER): Payer: 59 | Admitting: Psychiatry

## 2020-01-29 ENCOUNTER — Encounter: Payer: Self-pay | Admitting: Psychiatry

## 2020-01-29 ENCOUNTER — Other Ambulatory Visit: Payer: Self-pay

## 2020-01-29 DIAGNOSIS — F4321 Adjustment disorder with depressed mood: Secondary | ICD-10-CM

## 2020-01-29 NOTE — Progress Notes (Signed)
      [facility] Counselor/Therapist Progress Note  Patient ID: Olivia Armstrong, MRN: [id number],    Date: 01/29/2020  Time Spent: 50 minutes   Treatment Type: Individual Therapy  Reported Symptoms: irritable, sad  Mental Status Exam:  Appearance:   Casual     Behavior:  Appropriate  Motor:  Normal  Speech/Language:   Clear and Coherent  Affect:  Appropriate  Mood:  irritable and sad  Thought process:  normal  Thought content:    WNL  Sensory/Perceptual disturbances:    WNL  Orientation:  oriented to person, place, time/date and situation  Attention:  Good  Concentration:  Good  Memory:  WNL  Fund of knowledge:   Good  Insight:    Good  Judgment:   Good  Impulse Control:  Good   Risk Assessment: Danger to Self:  No Self-injurious Behavior: No Danger to Others: No Duty to Warn:no Physical Aggression / Violence:No  Access to Firearms a concern: No  Gang Involvement:No   Subjective: The client states that she is irritated and sad at the dynamics that have occurred in her friend group.  She states conflict broke out that included her and another girlfriend.  The client realized that people were trying to triangulate her into their issues.  "I have tried to avoid it to the best of my ability."  As it all continued on the client realized it was a big boundary issue for her.  She felt like people were not being direct and addressed her whole group in a very assertive way calling for better communication.  She also went to the main culprit directly and told him that it was inappropriate to do all the back channel talking he had been doing.  "It is wrong not to be direct."  She felt better after setting the boundary.  Since those occurrences the group has fallen apart.  This was disappointing to the client. The client's work continues to go well.  She has noted some issues at work that also have to do with poor communication.  She does not feel that she is in a position to do  anything.  She is trying to be supportive and encouraging to those around her.  She is hopeful that her direct communication will model better communication with the rest of the team's.  I encouraged the client continue to use her assertiveness skills and boundaries.  Also to practice that mood independent behavior with the things she finds difficult.  Interventions: Assertiveness/Communication, Motivational Interviewing, Solution-Oriented/Positive Psychology and Insight-Oriented  Diagnosis:   ICD-10-CM   1. Adjustment disorder with depressed mood  F43.21     Plan: Boundaries, assertiveness, self-care, positive self talk, engaged activities, modeling correct behavior.  Jashan Cotten, [facility]

## 2020-04-29 ENCOUNTER — Ambulatory Visit: Payer: 59 | Admitting: Psychiatry

## 2020-05-03 ENCOUNTER — Ambulatory Visit (INDEPENDENT_AMBULATORY_CARE_PROVIDER_SITE_OTHER): Payer: No Typology Code available for payment source | Admitting: Psychiatry

## 2020-05-03 ENCOUNTER — Encounter: Payer: Self-pay | Admitting: Psychiatry

## 2020-05-03 ENCOUNTER — Other Ambulatory Visit: Payer: Self-pay

## 2020-05-03 DIAGNOSIS — F4321 Adjustment disorder with depressed mood: Secondary | ICD-10-CM | POA: Diagnosis not present

## 2020-05-03 NOTE — Progress Notes (Signed)
      [facility] Counselor/Therapist Progress Note  Patient ID: Olivia Armstrong, MRN: [id number],    Date: 05/03/2020  Time Spent: 50 minutes   Treatment Type: Individual Therapy  Reported Symptoms: irritation, sadness  Mental Status Exam:  Appearance:   Well Groomed     Behavior:  Appropriate  Motor:  Normal  Speech/Language:   Clear and Coherent  Affect:  Appropriate  Mood:  irritable and sad  Thought process:  normal  Thought content:    WNL  Sensory/Perceptual disturbances:    WNL  Orientation:  oriented to person, place, time/date and situation  Attention:  Good  Concentration:  Good  Memory:  WNL  Fund of knowledge:   Good  Insight:    Good  Judgment:   Good  Impulse Control:  Good   Risk Assessment: Danger to Self:  No Self-injurious Behavior: No Danger to Others: No Duty to Warn:no Physical Aggression / Violence:No  Access to Firearms a concern: No  Gang Involvement:No   Subjective: Today the client's main question was how to navigate her dysfunctional office situation?  The client is concerned because of the lead on the study that they are doing is not assessing the data correctly.  This study is coming to an end without good data.  Client is very frustrated with the circumstances.  She has been trying to help the lead who is located in [place] to being more assertive and to communicate more effectively.  I pointed out to the client that this is really outside of her scope.  She is being over responsible.  The client agrees.  I used eye movement with the client focusing on her over responsibility.  As she processed her subjective units of distress mood from a 6 to less than 4.  She then switched over to another coworker who is leading another study.  She states this woman has too many mood fluctuations which catches the client off guard.  I asked the client how long this has been going on?  She stated at least for the last year.  I suggested that she expect the  mood swings.  Her best strategy would to be as neutral as possible.  She would have to develop a level of radical acceptance with this coworker concerning her mood.  The client agrees that she does need to do this.  She sees the bigger problem with her over responsibility and that it is not her job to fix it all.  She agreed.  Interventions: Assertiveness/Communication, Motivational Interviewing, Solution-Oriented/Positive Psychology, [employer] Desensitization and Reprocessing (EMDR) and Insight-Oriented  Diagnosis:   ICD-10-CM   1. Adjustment disorder with depressed mood  F43.21     Plan: Radical acceptance, mood independent behavior, positive self talk, self-care, assertiveness, boundaries.  Cayson Kalb, [facility]

## 2020-07-17 ENCOUNTER — Other Ambulatory Visit: Payer: Self-pay

## 2020-07-17 ENCOUNTER — Ambulatory Visit (INDEPENDENT_AMBULATORY_CARE_PROVIDER_SITE_OTHER): Payer: No Typology Code available for payment source | Admitting: Psychiatry

## 2020-07-17 ENCOUNTER — Encounter: Payer: Self-pay | Admitting: Psychiatry

## 2020-07-17 DIAGNOSIS — F4321 Adjustment disorder with depressed mood: Secondary | ICD-10-CM

## 2020-07-17 NOTE — Progress Notes (Signed)
      [facility] Counselor/Therapist Progress Note  Patient ID: NYEEMA WANT, MRN: [id number],    Date: 07/17/2020  Time Spent: 50 minutes   Treatment Type: Individual Therapy  Reported Symptoms: irritable  Mental Status Exam:  Appearance:   Casual and Well Groomed     Behavior:  Appropriate  Motor:  Normal  Speech/Language:   Clear and Coherent  Affect:  Appropriate  Mood:  irritable  Thought process:  normal  Thought content:    WNL  Sensory/Perceptual disturbances:    WNL  Orientation:  oriented to person, place, time/date and situation  Attention:  Good  Concentration:  Good  Memory:  WNL  Fund of knowledge:   Good  Insight:    Good  Judgment:   Good  Impulse Control:  Good   Risk Assessment: Danger to Self:  No Self-injurious Behavior: No Danger to Others: No Duty to Warn:no Physical Aggression / Violence:No  Access to Firearms a concern: No  Gang Involvement:No   Subjective: The client states that she has recently acquired a new position with a new company.  She was started on April 25.  She stated she become very irritable and frustrated at the disorganization that was happening within her unit and other groups in her company.  She felt the data sets were not being handled correctly or in a timely manner.  This raised her frustration significantly.  She chose to begin to look for a new job.  She was contacted by headhunter who worked with her to get her new job.  She went through 9 interviews to procure the job.  The client was very persistent and endured some rejection in the process.  She stated the skills that we had worked on about assertiveness and boundaries have really worked for her.  She finds herself applying those to her family of origin.  She is noticing some conflict with her older sister and her younger sister.  She has been able to stay out of it and not get into a codependent process.  She is very pleased that she has been able to not step in and  "mother" everyone.  I discussed with the client that I would be retiring at the end of April.  She agreed to consider a referral to Lanetta Inch, [facility].  Interventions: Assertiveness/Communication, Motivational Interviewing, Solution-Oriented/Positive Psychology and Insight-Oriented  Diagnosis:   ICD-10-CM   1. Adjustment disorder with depressed mood  F43.21     Plan: Assertiveness, boundaries, positive self talk, self-care, social network.  Carriann Hesse, [facility]

## 2020-09-12 ENCOUNTER — Ambulatory Visit: Payer: No Typology Code available for payment source | Admitting: Psychiatry

## 2020-10-25 IMAGING — MG DIGITAL SCREENING BILATERAL MAMMOGRAM WITH TOMO AND CAD
8 series · 9 of 24 positions shown · non-contrast
Comparison: Previous exam(s).

CLINICAL DATA: Screening.

EXAM:
DIGITAL SCREENING BILATERAL MAMMOGRAM WITH TOMO AND CAD

[R CC synth-2D]
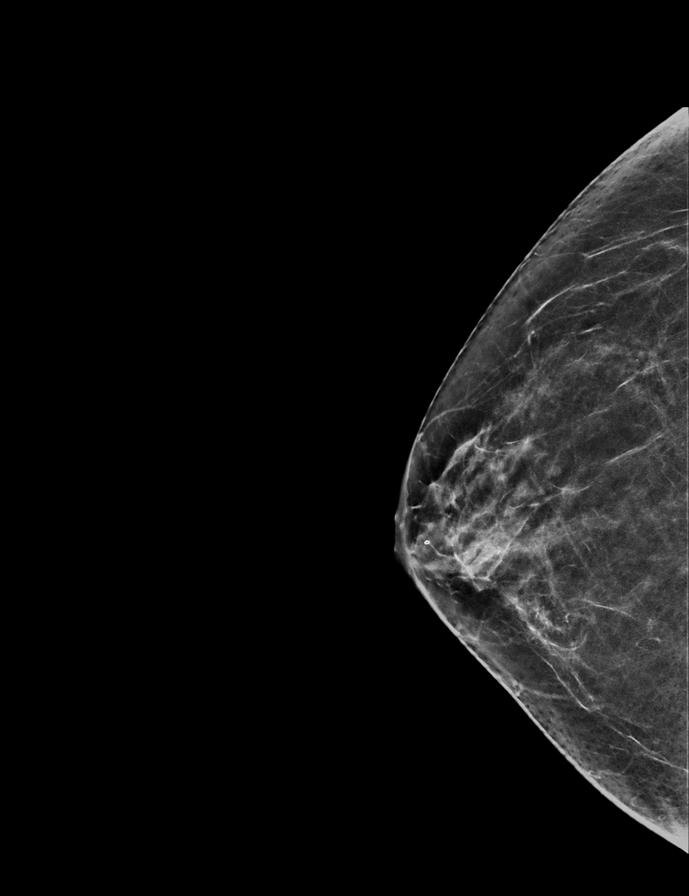

[L MLO synth-2D]
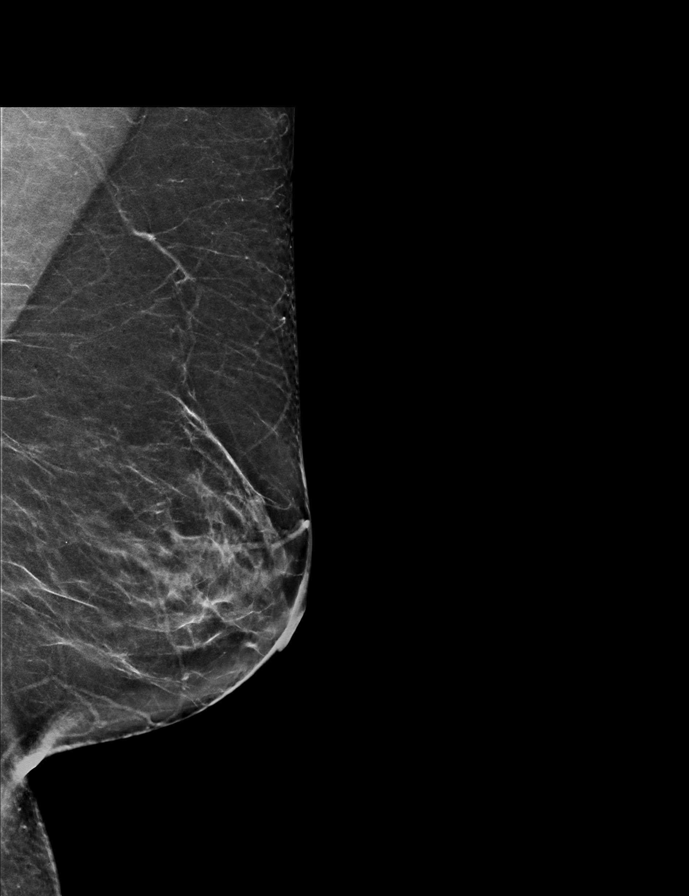

[L CC synth-2D]
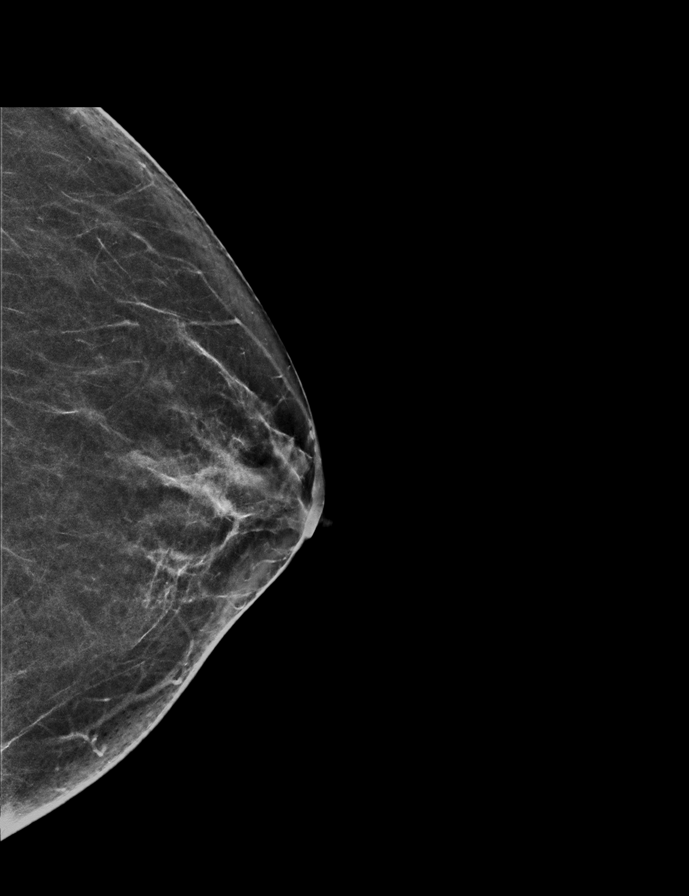

[R MLO synth-2D]
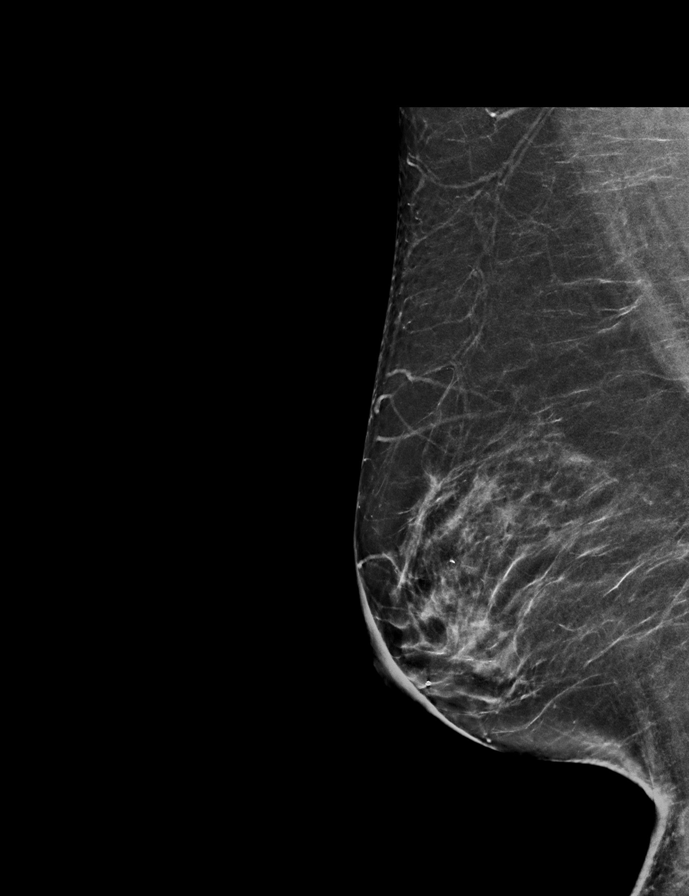

[L CC tomo · 2 of 61 frames shown]
[frame 20/61]
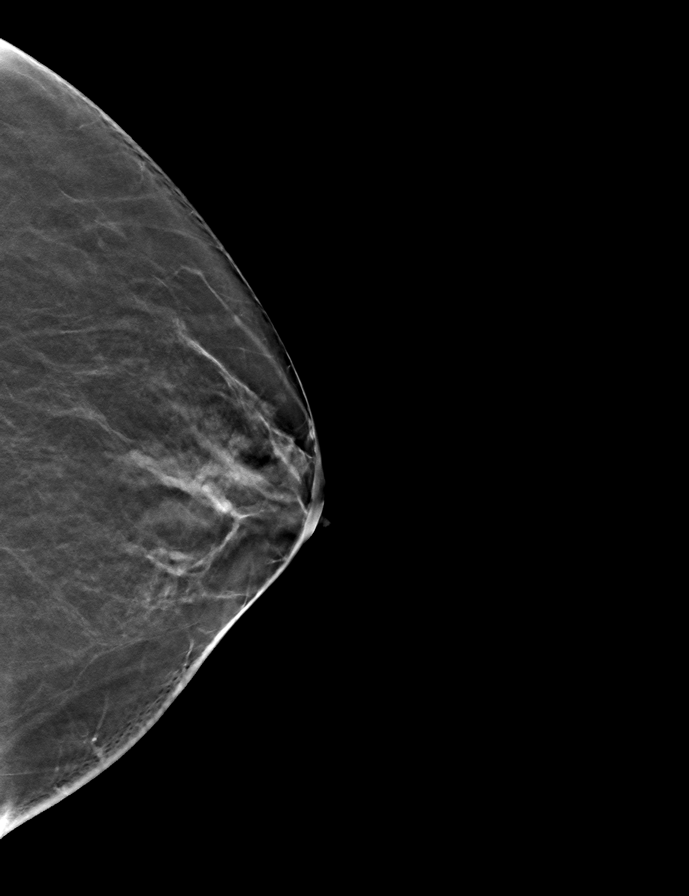
[frame 31/61]
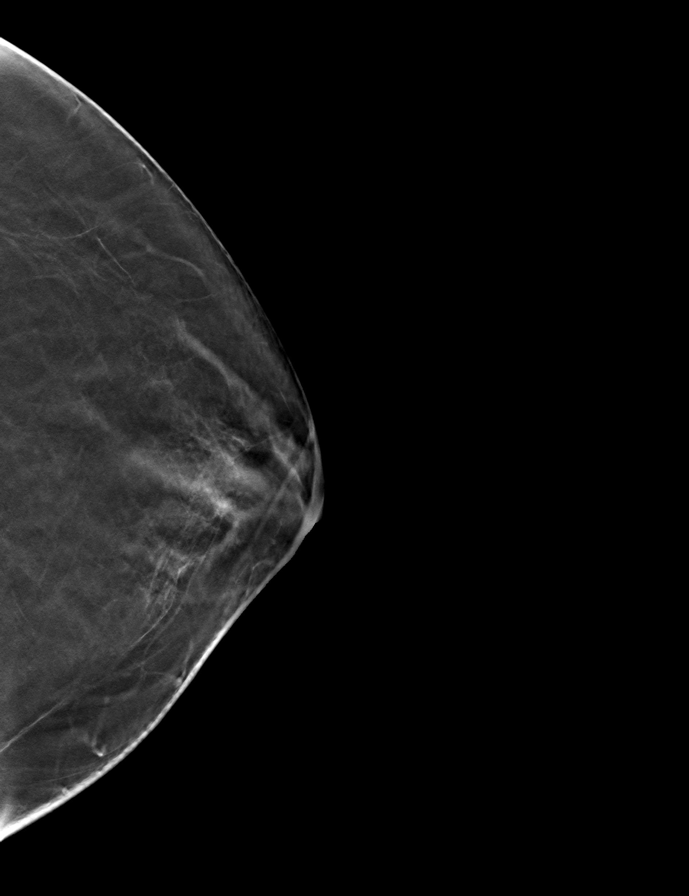

[R CC tomo · tomo slice 29/57.0]
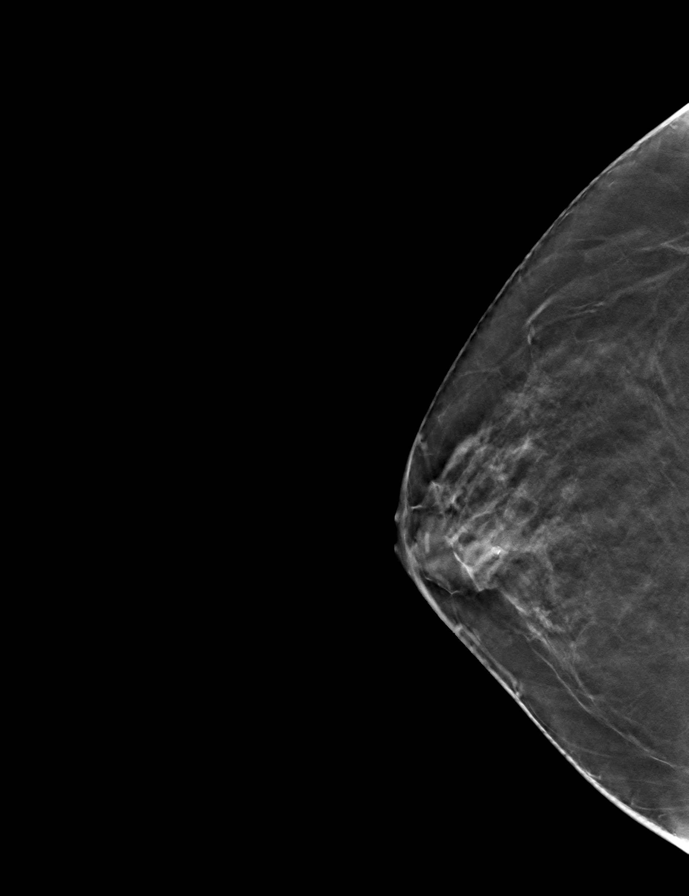

[R MLO tomo · tomo slice 33/64.0]
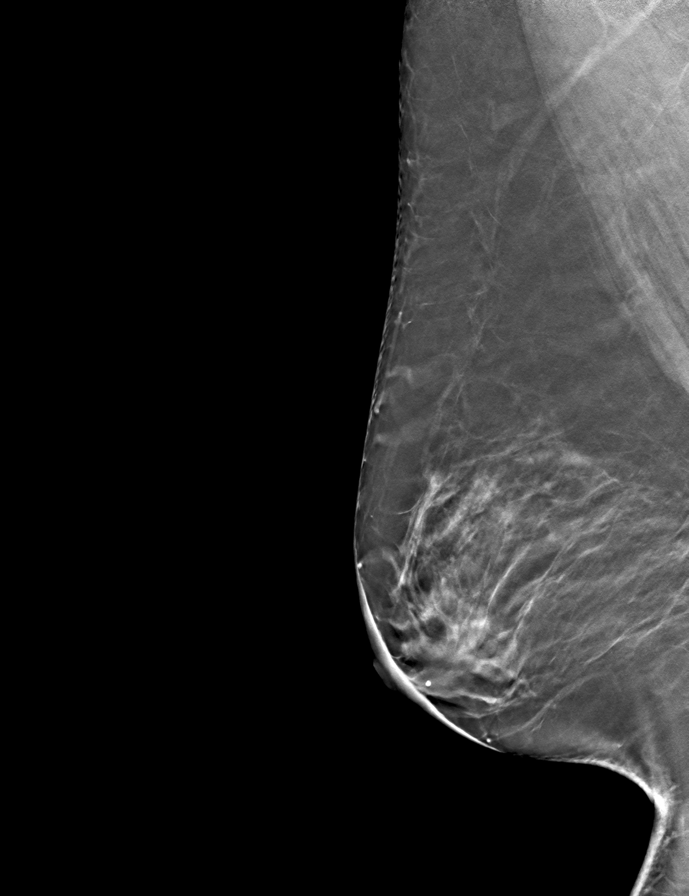

[L MLO tomo · tomo slice 31/62.0]
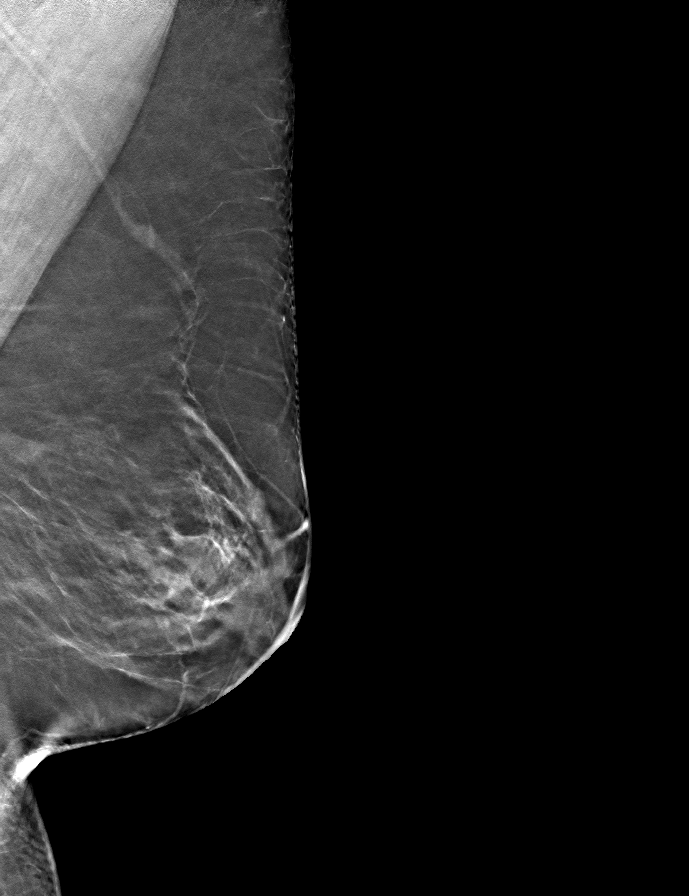

[9 of 24 positions shown; findings below may reference images not displayed]

ACR Breast Density Category b: There are scattered areas of
fibroglandular density.
FINDINGS: There are no findings suspicious for malignancy. Images were
processed with CAD.
IMPRESSION: No mammographic evidence of malignancy. A result letter of this
screening mammogram will be mailed directly to the patient.

RECOMMENDATION:
Screening mammogram in one year. (Code:[id number])

BI-RADS CATEGORY  1: Negative.

## 2021-03-03 ENCOUNTER — Other Ambulatory Visit: Payer: Self-pay | Admitting: Internal Medicine

## 2021-03-03 DIAGNOSIS — Z1231 Encounter for screening mammogram for malignant neoplasm of breast: Secondary | ICD-10-CM

## 2021-04-17 DIAGNOSIS — E559 Vitamin D deficiency, unspecified: Secondary | ICD-10-CM | POA: Insufficient documentation

## 2021-04-17 DIAGNOSIS — R7309 Other abnormal glucose: Secondary | ICD-10-CM | POA: Insufficient documentation

## 2021-04-28 DIAGNOSIS — M1812 Unilateral primary osteoarthritis of first carpometacarpal joint, left hand: Secondary | ICD-10-CM | POA: Insufficient documentation

## 2021-04-28 DIAGNOSIS — G5601 Carpal tunnel syndrome, right upper limb: Secondary | ICD-10-CM | POA: Insufficient documentation

## 2021-05-01 ENCOUNTER — Other Ambulatory Visit: Payer: Self-pay

## 2021-05-01 ENCOUNTER — Ambulatory Visit
Admission: RE | Admit: 2021-05-01 | Discharge: 2021-05-01 | Disposition: A | Discharge status: Home / Self Care | Payer: BC Managed Care – PPO | Source: Ambulatory Visit | Attending: Internal Medicine | Admitting: Internal Medicine

## 2021-05-01 DIAGNOSIS — Z1231 Encounter for screening mammogram for malignant neoplasm of breast: Secondary | ICD-10-CM | POA: Insufficient documentation

## 2021-07-14 ENCOUNTER — Emergency Department: Payer: BC Managed Care – PPO

## 2021-07-14 ENCOUNTER — Emergency Department
Admission: EM | Admit: 2021-07-14 | Discharge: 2021-07-14 | Disposition: A | Discharge status: Home / Self Care | Payer: BC Managed Care – PPO | Attending: Student in an Organized Health Care Education/Training Program | Admitting: Student in an Organized Health Care Education/Training Program

## 2021-07-14 ENCOUNTER — Other Ambulatory Visit: Payer: Self-pay

## 2021-07-14 DIAGNOSIS — R531 Weakness: Secondary | ICD-10-CM | POA: Insufficient documentation

## 2021-07-14 DIAGNOSIS — R55 Syncope and collapse: Secondary | ICD-10-CM | POA: Diagnosis present

## 2021-07-14 DIAGNOSIS — R1110 Vomiting, unspecified: Secondary | ICD-10-CM | POA: Insufficient documentation

## 2021-07-14 LAB — CBC
HCT: 40.2 % (ref 36.0–46.0)
Hemoglobin: 13.5 g/dL (ref 12.0–15.0)
MCH: 28.8 pg (ref 26.0–34.0)
MCHC: 33.6 g/dL (ref 30.0–36.0)
MCV: 85.9 fL (ref 80.0–100.0)
Platelets: 320 10*3/uL (ref 150–400)
RBC: 4.68 MIL/uL (ref 3.87–5.11)
RDW: 13.6 % (ref 11.5–15.5)
WBC: 14.5 10*3/uL — ABNORMAL HIGH (ref 4.0–10.5)
nRBC: 0 % (ref 0.0–0.2)

## 2021-07-14 LAB — BASIC METABOLIC PANEL
Anion gap: 13 (ref 5–15)
BUN: 14 mg/dL (ref 6–20)
CO2: 21 mmol/L — ABNORMAL LOW (ref 22–32)
Calcium: 9 mg/dL (ref 8.9–10.3)
Chloride: 103 mmol/L (ref 98–111)
Creatinine, Ser: 0.83 mg/dL (ref 0.44–1.00)
GFR, Estimated: >60 mL/min (ref >60)
Glucose, Bld: 198 mg/dL — ABNORMAL HIGH (ref 70–99)
Potassium: 3.6 mmol/L (ref 3.5–5.1)
Sodium: 137 mmol/L (ref 135–145)

## 2021-07-14 LAB — URINALYSIS, ROUTINE W REFLEX MICROSCOPIC
Bilirubin Urine: NEGATIVE
Glucose, UA: NEGATIVE mg/dL
Hgb urine dipstick: NEGATIVE
Ketones, ur: 20 mg/dL — AB
Leukocytes,Ua: NEGATIVE
Nitrite: NEGATIVE
Protein, ur: NEGATIVE mg/dL
Specific Gravity, Urine: 1.021 (ref 1.005–1.030)
pH: 5 (ref 5.0–8.0)

## 2021-07-14 LAB — TROPONIN I (HIGH SENSITIVITY): Troponin I (High Sensitivity): 3 ng/L (ref <18)

## 2021-07-14 MED ORDER — ONDANSETRON HCL 4 MG/2ML IJ SOLN
4.0000 mg | Freq: Once | INTRAMUSCULAR | Status: AC
  Administered 2021-07-14: 4 mg via INTRAVENOUS
  Filled 2021-07-14: qty 2

## 2021-07-14 MED ORDER — SODIUM CHLORIDE 0.9 % IV BOLUS
1000.0000 mL | Freq: Once | INTRAVENOUS | Status: AC
  Administered 2021-07-14: 1000 mL via INTRAVENOUS

## 2021-07-14 NOTE — ED Provider Notes (Signed)
? ?[facility] ?Provider Note ? ?Patient Contact: 9:54 PM (approximate) ? ? ?History  ? ?Nausea and Loss of Consciousness ? ? ?HPI ? ?Olivia Armstrong is a 56 y.o. female who presents the emergency department complaining of syncopal episode, nausea and emesis.  Patient states that she was giving blood today, has done this before with no issues.  However she states that her routine had been changed where she was up much earlier during the day than she normally is, had not been eating much during the day.  She gave blood, had a very fast draw completing the entire pint in less than 6 minutes.  Patient states that she stood up, started to feel lightheaded and dizzy and tried to fight it.  She states that then she had nausea, vomiting and passed out.  Patient states that she feels a little weak and jittery after her syncopal episode but is not having any headache, chest pain, shortness of breath, abdominal pain at this time.  She felt fine prior to the blood draw. ?  ? ? ?Physical Exam  ? ?Triage Vital Signs: ?ED Triage Vitals  ?Enc Vitals Group  ?   BP 07/14/21 2003 107/80  ?   Pulse Rate 07/14/21 2003 65  ?   Resp 07/14/21 2003 18  ?   Temp 07/14/21 2003 97.7 ?F (36.5 ?C)  ?   Temp Source 07/14/21 2003 Oral  ?   SpO2 07/14/21 1932 99 %  ?   Weight 07/14/21 2003 162 lb (73.5 kg)  ?   Height 07/14/21 2003 '5\' 4"'$  (1.626 m)  ?   Head Circumference --   ?   Peak Flow --   ?   Pain Score 07/14/21 2003 0  ?   Pain Loc --   ?   Pain Edu? --   ?   Excl. in [place]? --   ? ? ?Most recent vital signs: ?Vitals:  ? 07/14/21 1932 07/14/21 2003  ?BP:  107/80  ?Pulse:  65  ?Resp:  18  ?Temp:  97.7 ?F (36.5 ?C)  ?SpO2: 99% 99%  ? ? ? ?General: Alert and in no acute distress. ?Eyes:  PERRL. EOMI. ?Head: No acute traumatic findings  ?Neck: No stridor. No cervical spine tenderness to palpation.  ?Cardiovascular:  Good peripheral perfusion ?Respiratory: Normal respiratory effort without tachypnea or retractions. Lungs  CTAB. Good air entry to the bases with no decreased or absent breath sounds. ?Musculoskeletal: Full range of motion to all extremities.  ?Neurologic:  No gross focal neurologic deficits are appreciated.  Cranial nerves II through XII grossly intact at this time. ?Skin:   No rash noted ?Other: ? ? ?ED Results / Procedures / Treatments  ? ?Labs ?(all labs ordered are listed, but only abnormal results are displayed) ?Labs Reviewed  ?BASIC METABOLIC PANEL - Abnormal; Notable for the following components:  ?    Result Value  ? CO2 21 (*)   ? Glucose, Bld 198 (*)   ? All other components within normal limits  ?CBC - Abnormal; Notable for the following components:  ? WBC 14.5 (*)   ? All other components within normal limits  ?URINALYSIS, ROUTINE W REFLEX MICROSCOPIC - Abnormal; Notable for the following components:  ? Color, Urine YELLOW (*)   ? APPearance CLOUDY (*)   ? Ketones, ur 20 (*)   ? All other components within normal limits  ?CBG MONITORING, ED  ?TROPONIN I (HIGH SENSITIVITY)  ? ? ? ?EKG ? ?  ED ECG REPORT ?ICharline Bills Casee Knepp,  personally viewed and interpreted this ECG. ? ? Date: 07/14/2021 ? EKG Time: 2011 hrs. ? Rate: 60 bpm ? Rhythm: unchanged from previous tracings, normal sinus rhythm ? Axis: Normal axis ? Intervals:none ? ST&T Change: No ST elevation or depression noted ? ?Normal sinus rhythm, no STEMI.  No significant changes from previous EKG from 10/04/2018 ? ? ? ?RADIOLOGY ? ?I personally viewed and evaluated these images as part of my medical decision making, as well as reviewing the written report by the radiologist. ? ?ED Provider Interpretation: No acute cardiopulmonary findings on chest x-ray ? ?DG Chest 2 View ? ?Result Date: 07/14/2021 ?CLINICAL DATA:  Syncope. EXAM: CHEST - 2 VIEW COMPARISON:  None. FINDINGS: The heart size and mediastinal contours are within normal limits. Both lungs are clear. The visualized skeletal structures are unremarkable. IMPRESSION: No active cardiopulmonary  disease. Electronically Signed   By: Anner Crete M.D.   On: 07/14/2021 22:18   ? ?PROCEDURES: ? ?Critical Care performed: No ? ?Procedures ? ? ?MEDICATIONS ORDERED IN ED: ?Medications  ?sodium chloride 0.9 % bolus 1,000 mL (0 mLs Intravenous Stopped 07/14/21 2328)  ?ondansetron [facility]) injection 4 mg (4 mg Intravenous Given 07/14/21 2207)  ? ? ? ?IMPRESSION / MDM / ASSESSMENT AND PLAN / ED COURSE  ?I reviewed the triage vital signs and the nursing notes. ?             ?               ? ?Differential diagnosis includes, but is not limited to, vasovagal syncope, cardiogenic syncope, anemia ? ? ?Patient's diagnosis is consistent with vasovagal syncope.  Patient presented to the emergency department after donating blood and passing out.  Patient states that she was up much earlier than normal, had 8 at different times and less than normal.  She then stated to donate blood.  She states that they were able to rapidly remove a pint of her blood and after standing up from giving this blood she started feeling lightheaded, nauseated.  Patient did have an episode of emesis shortly followed with a syncopal episode.  Patient was given fluids, Zofran in the emergency department with good symptomatic improvement of her nausea.  Patient was neurologically intact.  There is no indication for imaging of her head at this time.  Remainder of her work-up is reassuring with reassuring labs, urinalysis, troponin, EKG.  Given the reported history I feel this is likely vasovagal after being tired, eating less than normal, and donating a plan of blood.  At this time I will discharge the patient.  Concerning signs and symptoms are discussed with the patient.  Follow-up primary care as needed.  Return to the ED for any concerning signs or symptoms..  Patient is given ED precautions to return to the ED for any worsening or new symptoms. ? ? ? ?  ? ? ?FINAL CLINICAL IMPRESSION(S) / ED DIAGNOSES  ? ?Final diagnoses:  ?Vasovagal syncope   ? ? ? ?Rx / DC Orders  ? ?ED Discharge Orders   ? ? None  ? ?  ? ? ? ?Note:  This document was prepared using Dragon voice recognition software and may include unintentional dictation errors. ?  ?Darletta Moll, PA-C ?07/15/21 9371 ? ?  ?Merlyn Lot, MD ?07/15/21 1515 ? ?

## 2021-07-14 NOTE — ED Triage Notes (Signed)
EMS brings pt to lobby via w/c with no distress noted; reports donated blood today since c/o nausea & dizziness, +syncope ?

## 2021-07-14 NOTE — ED Triage Notes (Signed)
Pt presents to ER c/o one episode of LOC, feeling light-headed and nausea that started today, 15 min after donating blood.  Pt had one episode of emesis as well today.  Pt is curently A&O x4 at this time in NAD in triage.  ?

## 2021-09-18 ENCOUNTER — Ambulatory Visit (INDEPENDENT_AMBULATORY_CARE_PROVIDER_SITE_OTHER): Payer: BC Managed Care – PPO | Admitting: Mental Health

## 2021-09-18 DIAGNOSIS — F4321 Adjustment disorder with depressed mood: Secondary | ICD-10-CM

## 2021-09-18 NOTE — Progress Notes (Signed)
[facility] Initial Adult Exam  Name: Olivia Armstrong Date: 09/18/2021 MRN: [id number] DOB: 26-Feb-1966 PCP: Idelle Crouch, MD  Time spent: 53 minutes  Reason for Visit Tyna Jaksch Problem: patient was in care w/ Georgana Curio, [facility] for about 10 years intermittently until 2022.  Her mother called recently, told her she could help her paint her bathroom; this upset patient, told her mother "I dont want any help".  Patient recognizes her tendency to be more frustrated and angry over the last few months.  She stated that she had 2 outbursts of anger over the last 6 months, 1 incident became upset with a worker who is talking to her on the phone about her water bill.  Patient stated that it upsets her to realize how she has gotten upset, struggles to understand what is causing her to get more angry and irritable recently.  She states that she can be isolated, not wanting to spend time with family or friends, reports a decrease in managing her hygiene at times as well as not managing her diet well, eating more unhealthy foods.  She stated she currently works full-time in a job that she really enjoys, obtained her degree a few years ago and finds her career fulfilling.  Mental Status Exam:    Appearance:    Casual     Behavior:   Appropriate  Motor:   WNL  Speech/Language:    Clear and Coherent  Affect:   Full range   Mood:   Pleasant, sad, tearful  Thought process:   Logical, linear, goal directed  Thought content:     WNL  Sensory/Perceptual disturbances:     none  Orientation:   x4  Attention:   Good  Concentration:   Good  Memory:   Intact  Fund of knowledge:    Consistent with age and development  Insight:     Good  Judgment:    Good  Impulse Control:   Good     Reported Symptoms:     Risk Assessment: Danger to Self:  No Self-injurious Behavior: No Danger to Others: No Duty to Warn:no Physical Aggression / Violence:No  Access to Firearms a concern: No  Gang  Involvement:No  Patient / guardian was educated about steps to take if suicide or homicide risk level increases between visits: yes While future psychiatric events cannot be accurately predicted, the patient does not currently require acute inpatient psychiatric care and does not currently meet [facility] involuntary commitment criteria.  Substance Abuse History: Current substance abuse:   Past Psychiatric History:   [facility] History of Psych Hospitalization: No  Psychological Testing: none   Family History:  Raised by both parents. 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  no treatment. saw dr. Marcelline Deist and had stress test but no diagnosis   GERD (gastroesophageal reflux disease)    winding down on antacids    Hyperlipidemia, unspecified 09/14/2013   Hyperthyroidism    Menopausal symptoms 06/10/2016   Overview:  prometrium '200mg'$  daily estrdiol patch 0.'05mg'$ /day q3days   Palpitations 09/14/2013   PONV (postoperative nausea and vomiting)    S/P trigger finger release 10/27/2013   Vitamin D deficiency     Past Surgical History:  Procedure Laterality Date   CARPAL TUNNEL RELEASE Left 01/05/2018   Procedure: CARPAL TUNNEL RELEASE ENDOSCOPIC;  Surgeon: Corky Mull, MD;  Location: [place];  Service: Orthopedics;  Laterality: Left;   CHOLECYSTECTOMY N/A 10/07/2018   Procedure: LAPAROSCOPIC CHOLECYSTECTOMY;  Surgeon: Herbert Pun, MD;  Location: [facility];  Service: General;  Laterality: N/A;   COLONOSCOPY  09/2012   COLONOSCOPY WITH PROPOFOL N/A 10/15/2017   Procedure: COLONOSCOPY WITH PROPOFOL;  Surgeon: Lucilla Lame, MD;  Location: [place];  Service: Endoscopy;  Laterality: N/A;   ESOPHAGOGASTRODUODENOSCOPY (EGD) WITH PROPOFOL N/A 10/15/2017   Procedure: ESOPHAGOGASTRODUODENOSCOPY (EGD) WITH PROPOFOL;  Surgeon: Lucilla Lame, MD;  Location: [place];  Service: Endoscopy;  Laterality: N/A;   HYSTEROSCOPY WITH D & C N/A 02/05/2017   Procedure: DILATATION AND CURETTAGE /HYSTEROSCOPY;  Surgeon: Ward, Honor Loh, MD;  Location: [facility];  Service: Gynecology;  Laterality: N/A;   POLYPECTOMY N/A 10/15/2017   Procedure: POLYPECTOMY;  Surgeon: Lucilla Lame, MD;  Location: [place];  Service: Endoscopy;  Laterality: N/A;   TRIGGER FINGER RELEASE Left 2015   thumb, under local    Medications: Current Outpatient Medications  Medication Sig Dispense Refill   Brimonidine Tartrate (LUMIFY) 0.025 % SOLN Place 1 drop into both eyes daily as needed (Itchy eyes).     Dapsone (ACZONE) 7.5 % GEL Apply 1 application topically 2 (two) times a day.      escitalopram (LEXAPRO) 10 MG tablet Take 10 mg by mouth daily.      estradiol (VIVELLE-DOT) 0.05 MG/24HR patch Place 1  patch onto the skin 2 (two) times a week. Change patch on Wed and Sun.     hydrocortisone 2.5 % ointment Apply 1 application topically 2 (two) times daily as needed (itching).      ibuprofen (ADVIL,MOTRIN) 200 MG tablet Take 600 mg by mouth every 6 (six) hours as needed for fever, headache or mild pain.      Nutritional Supplements (CANDIDA COMPLEX PO) Take 2 tablets by mouth daily.     Probiotic Product ([employer] PROBIOTIC MAXIMUM STRENGTH) CAPS Take 1 capsule by mouth daily.      progesterone (PROMETRIUM) 200 MG capsule Take 200 mg by mouth at bedtime.      thyroid (ARMOUR THYROID) 30 MG tablet Take 30 mg by mouth daily before breakfast.      tretinoin (RETIN-A) 0.05 % cream Apply 1 application topically at bedtime as needed (for skin care).     Vitamin D, Ergocalciferol, (  DRISDOL) 50000 units CAPS capsule Take 50,000 Units by mouth See admin instructions. Take 1 capsule twice a week on Tuesdays and Saturdays.     Zinc Sulfate (ZINC 15 PO) Take 2 drops by mouth daily.      zolpidem (AMBIEN) 5 MG tablet Take 5 mg by mouth at bedtime.      No current facility-administered medications for this visit.    Allergies  Allergen Reactions   Kiwi Extract Anaphylaxis    Itchy throat and tongue   Mirtazapine Other (See Comments)    Dizziness    Diagnoses:    ICD-10-CM   1. Adjustment disorder with depressed mood  F43.21       Plan of Care: TBD   Anson [place], [facility]

## 2021-10-01 ENCOUNTER — Ambulatory Visit (INDEPENDENT_AMBULATORY_CARE_PROVIDER_SITE_OTHER): Payer: BC Managed Care – PPO | Admitting: Mental Health

## 2021-10-01 DIAGNOSIS — F4321 Adjustment disorder with depressed mood: Secondary | ICD-10-CM

## 2021-10-01 NOTE — Progress Notes (Signed)
[facility] Psychotherapy Note   Name: Olivia Armstrong Date: 10/01/2021 MRN: [id number] DOB: [birth year] PCP: Idelle Crouch, MD  Time spent: 54 minutes  Treatment;  Ind. therapy  Mental Status Exam:    Appearance:    Casual     Behavior:   Appropriate  Motor:   WNL  Speech/Language:    Clear and Coherent  Affect:   Full range   Mood:   Pleasant, sad, tearful  Thought process:   Logical, linear, goal directed  Thought content:     WNL  Sensory/Perceptual disturbances:     none  Orientation:   x4  Attention:   Good  Concentration:   Good  Memory:   Intact  Fund of knowledge:    Consistent with age and development  Insight:     Good  Judgment:    Good  Impulse Control:   Good     Reported Symptoms:     Risk Assessment: Danger to Self:  No Self-injurious Behavior: No Danger to Others: No Duty to Warn:no Physical Aggression / Violence:No  Access to Firearms a concern: No  Gang Involvement:No  Patient / guardian was educated about steps to take if suicide or homicide risk level increases between visits: yes While future psychiatric events cannot be accurately predicted, the patient does not currently require acute inpatient psychiatric care and does not currently meet [facility] involuntary commitment criteria.    Allergies  Allergen Reactions   Kiwi Extract Anaphylaxis    Itchy throat and tongue   Mirtazapine Other (See Comments)    Dizziness    Diagnoses:    ICD-10-CM   1. Adjustment disorder with depressed mood  F43.21       Subjective:  Patient arrived on time for today session. Further assessed relevant history and identified needs with patient. Explored recent and more distant past events that patient associates her increased feelings of depression and agitation recently. She shared family history related to feelings of abandonment which she connected to present day, ongoing feelings of low self-confidence, tendencies toward experiencing  feelings of failure. She shared how she struggled to grasp the concept at work recently, how this connected to her feelings of failure which have perseverated for many years per patient. She connected it to the feeling of abandonment and her parents separated when she was a young child and further reinforced by not being fully accepted by her stepmother as her father remarried after the separation. She said she is trying to find her stepmother to feel more connected as a family identifying this as an unmet need. We discuss the benefits of journaling, however, she stated she has trying to let go of some of the distressful feelings in the past but they always resurface. Encourage continue journaling and facilitated patient refocusing on personal strengths/accomplishments  for the remainder of the session.  Plan:  Patient is to use CBT, mindfulness and coping skills to help manage decrease symptoms associated with their diagnosis.   Long-term goals:   Maintain symptom reduction: The patient will report sustained reduction in symptoms of depression using both CBT and mindfulness interventions. Improve emotional regulation: The patient will learn and apply CBT and mindfulness-based strategies to regulate emotions, such as mindfulness-based stress reduction and cognitive restructuring, and report an improvement in emotional regulation for at least 3 consecutive months progressively.   Short-term goal:  The patient will learn and apply CBT and mindfulness-based coping skills for managing anxiety and practice using it between sessions.  2.   The patient will CBT and mindfulness-based interventions to increase awareness of negative thought patterns and work to reframe them as needed.       3.   Patient to identify self supportive thoughts improving her self confidence to feeling "I am enough".       4.   Patient to care for herself by getting adequate rest.      Anson [place], [facility]

## 2021-10-29 ENCOUNTER — Ambulatory Visit (INDEPENDENT_AMBULATORY_CARE_PROVIDER_SITE_OTHER): Payer: BC Managed Care – PPO | Admitting: Mental Health

## 2021-10-29 DIAGNOSIS — F4321 Adjustment disorder with depressed mood: Secondary | ICD-10-CM

## 2021-10-29 NOTE — Progress Notes (Signed)
[facility] Psychotherapy Note   Name: Olivia Armstrong Date: 10/29/2021 MRN: [id number] DOB: 03-19-1966 PCP: Idelle Crouch, MD  Time spent: 53 minutes  Treatment;  Ind. therapy  Mental Status Exam:    Appearance:    Casual     Behavior:   Appropriate  Motor:   WNL  Speech/Language:    Clear and Coherent  Affect:   Full range   Mood:   Pleasant, sad, tearful  Thought process:   Logical, linear, goal directed  Thought content:     WNL  Sensory/Perceptual disturbances:     none  Orientation:   x4  Attention:   Good  Concentration:   Good  Memory:   Intact  Fund of knowledge:    Consistent with age and development  Insight:     Good  Judgment:    Good  Impulse Control:   Good     Reported Symptoms:     Risk Assessment: Danger to Self:  No Self-injurious Behavior: No Danger to Others: No Duty to Warn:no Physical Aggression / Violence:No  Access to Firearms a concern: No  Gang Involvement:No  Patient / guardian was educated about steps to take if suicide or homicide risk level increases between visits: yes While future psychiatric events cannot be accurately predicted, the patient does not currently require acute inpatient psychiatric care and does not currently meet [facility] involuntary commitment criteria.    Allergies  Allergen Reactions   Kiwi Extract Anaphylaxis    Itchy throat and tongue   Mirtazapine Other (See Comments)    Dizziness    Diagnoses:    ICD-10-CM   1. Adjustment disorder with depressed mood  F43.21        Subjective:  Patient arrived on time for today session.  Assessed recent events, progress.  Patient shared recent interactions with family on vacation going on to share more details related to these relationships.  Patient went on to share more family related history which she feels plays a role in her ongoing challenges with her ongoing feelings of low self confidence and at times self worth.  She also continues to  identify feelings of failure where we continue to work with her from a cognitive behavioral framework assisting her in reframing throughout session.   Plan:  Patient is to use CBT, mindfulness and coping skills to help manage decrease symptoms associated with their diagnosis.   Long-term goals:   Maintain symptom reduction: The patient will report sustained reduction in symptoms of depression using both CBT and mindfulness interventions. Improve emotional regulation: The patient will learn and apply CBT and mindfulness-based strategies to regulate emotions, such as mindfulness-based stress reduction and cognitive restructuring, and report an improvement in emotional regulation for at least 3 consecutive months progressively.   Short-term goal:  The patient will learn and apply CBT and mindfulness-based coping skills for managing anxiety and practice using it between sessions.       2.   The patient will CBT and mindfulness-based interventions to increase awareness of negative thought patterns and work to reframe them as needed.       3.   Patient to identify self supportive thoughts improving her self confidence to feeling "I am enough".       4.   Patient to care for herself by getting adequate rest.      Anson [place], [facility]

## 2021-11-19 ENCOUNTER — Ambulatory Visit (INDEPENDENT_AMBULATORY_CARE_PROVIDER_SITE_OTHER): Payer: BC Managed Care – PPO | Admitting: Mental Health

## 2021-11-19 DIAGNOSIS — F4321 Adjustment disorder with depressed mood: Secondary | ICD-10-CM

## 2021-11-19 NOTE — Progress Notes (Signed)
[facility] Counselor Psychotherapy Note   Name: Olivia Armstrong Date: 11/19/21 MRN: [id number] DOB: 1965-04-25 PCP: Idelle Crouch, MD  Time spent: 55 minutes  Treatment;  Ind. therapy  Mental Status Exam:    Appearance:    Casual     Behavior:   Appropriate  Motor:   WNL  Speech/Language:    Clear and Coherent  Affect:   Full range   Mood:   Pleasant, sad, tearful  Thought process:   Logical, linear, goal directed  Thought content:     WNL  Sensory/Perceptual disturbances:     none  Orientation:   x4  Attention:   Good  Concentration:   Good  Memory:   Intact  Fund of knowledge:    Consistent with age and development  Insight:     Good  Judgment:    Good  Impulse Control:   Good     Reported Symptoms:     Risk Assessment: Danger to Self:  No Self-injurious Behavior: No Danger to Others: No Duty to Warn:no Physical Aggression / Violence:No  Access to Firearms a concern: No  Gang Involvement:No  Patient / guardian was educated about steps to take if suicide or homicide risk level increases between visits: yes While future psychiatric events cannot be accurately predicted, the patient does not currently require acute inpatient psychiatric care and does not currently meet [facility] involuntary commitment criteria.    Allergies  Allergen Reactions   Kiwi Extract Anaphylaxis    Itchy throat and tongue   Mirtazapine Other (See Comments)    Dizziness    Diagnoses:    ICD-10-CM   1. Adjustment disorder with depressed mood  F43.21         Subjective:  Patient arrived on time for today session.  Assessed recent events, progress toward goals.  Patient shared how her mood has been less distressed over the last several weeks, decreased irritability and sadness as experienced over the last few months.  Explored recent stressors with patient where she stated she has also realized that some of her stress over the last few months is related to the loss of a  friend.  She went on to share how the friendship had changed over the last year to a point to where she continues to have contact with this friend but going on to share several details about how their communication has changed to where she feels less close to this friend.  Patient shared her effort to communicate with her friend about what she began to experience however, she was given no specific reason why the change had occurred.  Through guided discovery, patient identified the need to continue to work on gaining acceptance of the current situation facilitating her framing needs with also a focus on avoiding negative self-talk that can lead to her having lower confidence and feeling like she is "enough".  Plan:  Patient is to use CBT, mindfulness and coping skills to help manage decrease symptoms associated with their diagnosis.   Long-term goals:   Maintain symptom reduction: The patient will report sustained reduction in symptoms of depression using both CBT and mindfulness interventions. Improve emotional regulation: The patient will learn and apply CBT and mindfulness-based strategies to regulate emotions, such as mindfulness-based stress reduction and cognitive restructuring, and report an improvement in emotional regulation for at least 3 consecutive months progressively.   Short-term goal:  The patient will learn and apply CBT and mindfulness-based coping skills for managing anxiety and practice  using it between sessions.       2.   The patient will CBT and mindfulness-based interventions to increase awareness of negative thought patterns and work to reframe them as needed.       3.   Patient to identify self supportive thoughts improving her self confidence to feeling "I am enough".       4.   Patient to care for herself by getting adequate rest.      Anson [place], [facility]

## 2021-11-25 ENCOUNTER — Encounter: Payer: Self-pay | Admitting: Adult Health

## 2021-11-25 ENCOUNTER — Ambulatory Visit (INDEPENDENT_AMBULATORY_CARE_PROVIDER_SITE_OTHER): Payer: BC Managed Care – PPO | Admitting: Adult Health

## 2021-11-25 VITALS — BP 148/83 | HR 73 | Ht 63.5 in | Wt 159.0 lb

## 2021-11-25 DIAGNOSIS — F411 Generalized anxiety disorder: Secondary | ICD-10-CM | POA: Diagnosis not present

## 2021-11-25 DIAGNOSIS — F428 Other obsessive-compulsive disorder: Secondary | ICD-10-CM | POA: Diagnosis not present

## 2021-11-25 DIAGNOSIS — F324 Major depressive disorder, single episode, in partial remission: Secondary | ICD-10-CM

## 2021-11-25 DIAGNOSIS — G4700 Insomnia, unspecified: Secondary | ICD-10-CM | POA: Diagnosis not present

## 2021-11-25 NOTE — Progress Notes (Signed)
[facility] MD/PA/NP Initial Note  11/25/2021 6:03 PM EMER ONNEN  MRN:  [id number]  Chief Complaint:   HPI:   Patient seen today for initial psychiatric evaluation.  Referred by Lanetta Inch.  Describes mood today as "ok". Pleasant. Tearful at times. Mood symptoms - reports depression, anxiety, and irritability. Reports worry and rumination. Reports obsessive thoughts. Struggles with irrational thinking. Likes things a certain way. Her work is organized - "I like order there", but her house is not - things are clean, just not put away. Mood is consistent. Currently taking Lexapro '10mg'$  daily and Ambien '5mg'$  at bedtime, but feels like there is room for improvement. Has tried various medications over the years and would like to initiate the [place] testing before making further medication changes. Stable interest and motivation. Taking medications as prescribed.  Energy levels low - "could be better". Active, does not have a regular exercise routine. Enjoys some usual interests and activities. Divorced. Lives with cat - [name] - 19.5 years. Sister in [place] and [place]. Spending time with family. Appetite adequate. Weight stable - 159 pounds. Sleeps well most nights - difficulties getting to sleep. Averages 7 hours. Focus and concentration difficulties - easily distracted. Completing tasks. Managing aspects of household. 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 Denies SI or HI.  Denies AH or VH. Denies self harm. Substance use - THC daily.  Previous medication trials: Lexapro, Ambien, Prozac, Trazadone, Zoloft  Visit Diagnosis:    ICD-10-CM   1. Major depressive disorder in partial remission, unspecified whether recurrent ([place])  F32.4     2. Insomnia, unspecified type  G47.00     3. Generalized anxiety disorder  F41.1     4. Obsessional thoughts  F42.8       Past Psychiatric History: Suicide attempt at 64 - was not hospitalized.   Past Medical History:  Past Medical  History:  Diagnosis Date   Anxiety and depression 09/14/2013   Depression 07/30/2016   Dysrhythmia    palpitations.  no treatment. saw dr. Marcelline Deist and had stress test but no diagnosis   GERD (gastroesophageal reflux disease)    winding down on antacids   Hyperlipidemia, unspecified 09/14/2013   Hyperthyroidism    Menopausal symptoms 06/10/2016   Overview:  prometrium '200mg'$  daily estrdiol patch 0.'05mg'$ /day q3days   Palpitations 09/14/2013   PONV (postoperative nausea and vomiting)    S/P trigger finger release 10/27/2013   Vitamin D deficiency     Past Surgical History:  Procedure Laterality Date   CARPAL TUNNEL RELEASE Left 01/05/2018   Procedure: CARPAL TUNNEL RELEASE ENDOSCOPIC;  Surgeon: Corky Mull, MD;  Location: [place];  Service: Orthopedics;  Laterality: Left;   CHOLECYSTECTOMY N/A 10/07/2018   Procedure: LAPAROSCOPIC CHOLECYSTECTOMY;  Surgeon: Herbert Pun, MD;  Location: [facility];  Service: General;  Laterality: N/A;   COLONOSCOPY  09/2012   COLONOSCOPY WITH PROPOFOL N/A 10/15/2017   Procedure: COLONOSCOPY WITH PROPOFOL;  Surgeon: Lucilla Lame, MD;  Location: [place];  Service: Endoscopy;  Laterality: N/A;   ESOPHAGOGASTRODUODENOSCOPY (EGD) WITH PROPOFOL N/A 10/15/2017   Procedure: ESOPHAGOGASTRODUODENOSCOPY (EGD) WITH PROPOFOL;  Surgeon: Lucilla Lame, MD;  Location: [place];  Service: Endoscopy;  Laterality: N/A;   HYSTEROSCOPY WITH D & C N/A 02/05/2017   Procedure: DILATATION AND CURETTAGE /HYSTEROSCOPY;  Surgeon: Ward, Honor Loh, MD;  Location: [facility];  Service: Gynecology;  Laterality: N/A;   POLYPECTOMY N/A 10/15/2017   Procedure: POLYPECTOMY;  Surgeon: Lucilla Lame, MD;  Location: [facility];  Service: Endoscopy;  Laterality: N/A;   TRIGGER FINGER RELEASE Left 2015   thumb, under local    Family Psychiatric History: Denies any known family history of mental illness.   Family History:  Family History  Problem  Relation Age of Onset   Breast cancer Maternal Grandmother 29       mat great gm   Colon cancer Sister     Social History:  Social History   Socioeconomic History   Marital status: Divorced    Spouse name: Not on file   Number of children: Not on file   Years of education: Not on file   Highest education level: Not on file  Occupational History   Not on file  Tobacco Use   Smoking status: Never   Smokeless tobacco: Never  Vaping Use   Vaping Use: Never used  Substance and Sexual Activity   Alcohol use: Yes    Comment: occasionally   Drug use: No   Sexual activity: Never  Other Topics Concern   Not on file  Social History Narrative   Not on file   Social Determinants of Health   Financial Resource Strain: Not on file  Food Insecurity: Not on file  Transportation Needs: Not on file  Physical Activity: Not on file  Stress: Not on file  Social Connections: Not on file    Allergies:  Allergies  Allergen Reactions   Kiwi Extract Anaphylaxis    Itchy throat and tongue   Mirtazapine Other (See Comments)    Dizziness    Metabolic Disorder Labs: No results found for: "HGBA1C", "MPG" No results found for: "PROLACTIN" No results found for: "CHOL", "TRIG", "HDL", "CHOLHDL", "VLDL", "LDLCALC" No results found for: "TSH"  Therapeutic Level Labs: No results found for: "LITHIUM" No results found for: "VALPROATE" No results found for: "CBMZ"  Current Medications: Current Outpatient Medications  Medication Sig Dispense Refill   Brimonidine Tartrate (LUMIFY) 0.025 % SOLN Place 1 drop into both eyes daily as needed (Itchy eyes).     Dapsone (ACZONE) 7.5 % GEL Apply 1 application topically 2 (two) times a day.      escitalopram (LEXAPRO) 10 MG tablet Take 10 mg by mouth daily.      estradiol (VIVELLE-DOT) 0.05 MG/24HR patch Place 1 patch onto the skin 2 (two) times a week. Change patch on Wed and Sun.     hydrocortisone 2.5 % ointment Apply 1 application topically 2  (two) times daily as needed (itching).      ibuprofen (ADVIL,MOTRIN) 200 MG tablet Take 600 mg by mouth every 6 (six) hours as needed for fever, headache or mild pain.      Nutritional Supplements (CANDIDA COMPLEX PO) Take 2 tablets by mouth daily.     Probiotic Product ([employer] PROBIOTIC MAXIMUM STRENGTH) CAPS Take 1 capsule by mouth daily.      progesterone (PROMETRIUM) 200 MG capsule Take 200 mg by mouth at bedtime.      thyroid (ARMOUR THYROID) 30 MG tablet Take 30 mg by mouth daily before breakfast.      tretinoin (RETIN-A) 0.05 % cream Apply 1 application topically at bedtime as needed (for skin care).     Vitamin D, Ergocalciferol, (DRISDOL) 50000 units CAPS capsule Take 50,000 Units by mouth See admin instructions. Take 1 capsule twice a week on Tuesdays and Saturdays.     Zinc Sulfate (ZINC 15 PO) Take 2 drops by mouth daily.      zolpidem (AMBIEN) 5 MG tablet Take  5 mg by mouth at bedtime.      No current facility-administered medications for this visit.    Medication Side Effects: none  Orders placed this visit:  No orders of the defined types were placed in this encounter.   Psychiatric Specialty Exam:  Review of Systems  Respiratory:  Negative for chest tightness.   Musculoskeletal:  Negative for gait problem.  Neurological:  Negative for tremors.  Psychiatric/Behavioral:         Please refer to HPI    Blood pressure (!) 148/83, pulse 73, height 5' 3.5" (1.613 m), weight 159 lb (72.1 kg).Body mass index is 27.72 kg/m.  General Appearance: Casual and Neat  Eye Contact:  Good  Speech:  Clear and Coherent and Normal Rate  Volume:  Normal  Mood:  Euthymic  Affect:  Appropriate and Non-Congruent  Thought Process:  Coherent and Descriptions of Associations: Intact  Orientation:  Full (Time, Place, and Person)  Thought Content: Logical   Suicidal Thoughts:  No  Homicidal Thoughts:  No  Memory:  WNL  Judgement:  Good  Insight:  Good  Psychomotor Activity:  Normal   Concentration:  Concentration: Good and Attention Span: Good  Recall:  Good  Fund of Knowledge: Good  Language: Good  Assets:  Communication Skills Desire for Improvement Financial Resources/Insurance Housing Intimacy Leisure Time Physical Health Resilience Social Support Talents/Skills Transportation Vocational/Educational  ADL's:  Intact  Cognition: WNL  Prognosis:  Good   Screenings:  [place] ED from 07/14/2021 in [place] No Risk       Receiving Psychotherapy: Yes   Treatment Plan/Recommendations:  Plan:  PDMP reviewed  Continue:  Lexapro '10mg'$  daily - several years Ambien '5mg'$  at hs for sleep  Initiate [employer] testing. Will await results before making further medication changes.  Time spent with patient was 60 minutes. Greater than 50% of face to face time with patient was spent on counseling and coordination of care.    RTC 4 weeks  Patient advised to contact office with any questions, adverse effects, or acute worsening in signs and symptoms.      Aloha Gell, NP

## 2021-12-03 ENCOUNTER — Telehealth: Payer: Self-pay | Admitting: Adult Health

## 2021-12-03 ENCOUNTER — Encounter: Payer: Self-pay | Admitting: Adult Health

## 2021-12-03 NOTE — Telephone Encounter (Signed)
Noted. Ty!

## 2021-12-03 NOTE — Telephone Encounter (Signed)
FYI [facility] genesight in your box. Next apt 9/12.

## 2021-12-18 ENCOUNTER — Ambulatory Visit (INDEPENDENT_AMBULATORY_CARE_PROVIDER_SITE_OTHER): Payer: BC Managed Care – PPO | Admitting: Mental Health

## 2021-12-18 DIAGNOSIS — F324 Major depressive disorder, single episode, in partial remission: Secondary | ICD-10-CM

## 2021-12-18 NOTE — Progress Notes (Signed)
[facility] Counsel[facility] Psychotherapy Note   Name: Olivia Armstrong Date: 12/18/21 MRN: [id number] DOB: [birth year] PCP: Idelle Crouch, MD  Time spent: 45 minutes  Treatment;  Ind. therapy  Mental Status Exam:    Appearance:    Casual     Behavior:   Appropriate  Motor:   WNL  Speech/Language:    Clear and Coherent  Affect:   Full range   Mood:   Pleasant, sad, tearful  Thought process:   Logical, linear, goal directed  Thought content:     WNL  Sensory/Perceptual disturbances:     none  Orientation:   x4  Attention:   Good  Concentration:   Good  Memory:   Intact  Fund of knowledge:    Consistent with age and development  Insight:     Good  Judgment:    Good  Impulse Control:   Good     Reported Symptoms:     Risk Assessment: Danger to Self:  No Self-injurious Behavior: No Danger to Others: No Duty to Warn:no Physical Aggression / Violence:No  Access to Firearms a concern: No  Gang Involvement:No  Patient / guardian was educated about steps to take if suicide or homicide risk level increases between visits: yes While future psychiatric events cannot be accurately predicted, the patient does not currently require acute inpatient psychiatric care and does not currently meet [facility] involuntary commitment criteria.    Allergies  Allergen Reactions   Kiwi Extract Anaphylaxis    Itchy throat and tongue   Mirtazapine Other (See Comments)    Dizziness    Diagnoses:    ICD-10-CM   1. Major depressive disorder in partial remission, unspecified whether recurrent ([place])  F32.4          Subjective:  Patient arrived a few minutes late for today's session.  Assessed recent events and relevant progress.  Patient shared recent changes, the session primarily centered around the loss of her ex-husband.  She stated that they divorced about 14 years ago, however, kept in touch and continued to have a friendship over the past several years following their  separation.  Facilitated patient identifying events surrounding this loss, thoughts and feelings related to their relational history and ongoing challenges.  Identified stress related to one of her ex-husband's brothers behavior since his passing which patient has had to experience.  Facilitated patient sharing thoughts and feelings, coping to utilize as she lives through the grief process.   Plan:  Patient is to use CBT, mindfulness and coping skills to help manage decrease symptoms associated with their diagnosis.   Long-term goals:   Maintain symptom reduction: The patient will report sustained reduction in symptoms of depression using both CBT and mindfulness interventions. Improve emotional regulation: The patient will learn and apply CBT and mindfulness-based strategies to regulate emotions, such as mindfulness-based stress reduction and cognitive restructuring, and report an improvement in emotional regulation for at least 3 consecutive months progressively.   Short-term goal:  The patient will learn and apply CBT and mindfulness-based coping skills for managing anxiety and practice using it between sessions.       2.   The patient will CBT and mindfulness-based interventions to increase awareness of negative thought patterns and work to reframe them as needed.       3.   Patient to identify self supportive thoughts improving her self confidence to feeling "I am enough".       4.   Patient to care for herself  by getting adequate rest.      Anson [place], [facility]

## 2021-12-30 ENCOUNTER — Encounter: Payer: Self-pay | Admitting: Adult Health

## 2021-12-30 ENCOUNTER — Ambulatory Visit (INDEPENDENT_AMBULATORY_CARE_PROVIDER_SITE_OTHER): Payer: BC Managed Care – PPO | Admitting: Adult Health

## 2021-12-30 DIAGNOSIS — F428 Other obsessive-compulsive disorder: Secondary | ICD-10-CM | POA: Diagnosis not present

## 2021-12-30 DIAGNOSIS — F4321 Adjustment disorder with depressed mood: Secondary | ICD-10-CM

## 2021-12-30 DIAGNOSIS — F324 Major depressive disorder, single episode, in partial remission: Secondary | ICD-10-CM

## 2021-12-30 DIAGNOSIS — G4700 Insomnia, unspecified: Secondary | ICD-10-CM

## 2021-12-30 DIAGNOSIS — F411 Generalized anxiety disorder: Secondary | ICD-10-CM | POA: Diagnosis not present

## 2021-12-30 MED ORDER — DESVENLAFAXINE SUCCINATE ER 50 MG PO TB24: 50.0000 mg | ORAL_TABLET | Freq: Every day | ORAL | 1 refills | Status: DC

## 2021-12-30 NOTE — Progress Notes (Signed)
Olivia Armstrong [id number] [birth year] 56 y.o.  Subjective:   Patient ID:  Olivia Armstrong is a 56 y.o. (DOB 15-Aug-1965) female.  Chief Complaint: No chief complaint on file.   HPI Olivia Armstrong presents to the office today for follow-up of MDD, GAD, obsessional thoughts and acts, adjustment disorder and insomnia.  Describes mood today as "ok". Pleasant. Tearful "more so lately". Mood symptoms - reports depression - "a constant -  always there but ebbs and flows with severity". Ex husband recently passed away from a heart attack. Reports anxiety and irritability. Reports some worry and rumination. Reports some obsessive thoughts. Struggles with irrational thinking.  Mood is consistent. Currently taking Lexapro '10mg'$  daily and Ambien '5mg'$  at bedtime, but is willing to consider other options. Stable interest and motivation. Taking medications as prescribed. Energy levels low. Active, does not have a regular exercise routine. Enjoys some usual interests and activities. Divorced. Lives with cat - "[name]" - 19.5 years. Has also adopted her ex-husband's cat "Percell Miller" 11 year old. Sister in [place] and [place]. Spending time with family. Appetite adequate. Weight stable - 159 pounds. Sleeps well most nights. Averages 7 hours. Focus and concentration difficulties - "very diminished". Completing tasks. Managing aspects of household. 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 Denies SI or HI.  Denies AH or VH. Denies self harm. Substance use - THC daily.  Previous medication trials: Lexapro, Ambien, Prozac, Trazadone, Zoloft   Flowsheet Row ED from 07/14/2021 in [place] CATEGORY No Risk        Review of Systems:  Review of Systems  Musculoskeletal:  Negative for gait problem.  Neurological:  Negative for tremors.  Psychiatric/Behavioral:         Please refer to HPI    Medications: I have reviewed the patient's current  medications.  Current Outpatient Medications  Medication Sig Dispense Refill   Brimonidine Tartrate (LUMIFY) 0.025 % SOLN Place 1 drop into both eyes daily as needed (Itchy eyes).     Dapsone (ACZONE) 7.5 % GEL Apply 1 application topically 2 (two) times a day.      escitalopram (LEXAPRO) 10 MG tablet Take 10 mg by mouth daily.      estradiol (VIVELLE-DOT) 0.05 MG/24HR patch Place 1 patch onto the skin 2 (two) times a week. Change patch on Wed and Sun.     hydrocortisone 2.5 % ointment Apply 1 application topically 2 (two) times daily as needed (itching).      ibuprofen (ADVIL,MOTRIN) 200 MG tablet Take 600 mg by mouth every 6 (six) hours as needed for fever, headache or mild pain.      Nutritional Supplements (CANDIDA COMPLEX PO) Take 2 tablets by mouth daily.     Probiotic Product ([employer]S PROBIOTIC MAXIMUM STRENGTH) CAPS Take 1 capsule by mouth daily.      progesterone (PROMETRIUM) 200 MG capsule Take 200 mg by mouth at bedtime.      thyroid (ARMOUR THYROID) 30 MG tablet Take 30 mg by mouth daily before breakfast.      tretinoin (RETIN-A) 0.05 % cream Apply 1 application topically at bedtime as needed (for skin care).     Vitamin D, Ergocalciferol, (DRISDOL) 50000 units CAPS capsule Take 50,000 Units by mouth See admin instructions. Take 1 capsule twice a week on Tuesdays and Saturdays.     Zinc Sulfate (ZINC 15 PO) Take 2 drops by mouth daily.      zolpidem (AMBIEN) 5 MG tablet Take 5  mg by mouth at bedtime.      No current facility-administered medications for this visit.    Medication Side Effects: None  Allergies:  Allergies  Allergen Reactions   Kiwi Extract Anaphylaxis    Itchy throat and tongue   Mirtazapine Other (See Comments)    Dizziness    Past Medical History:  Diagnosis Date   Anxiety and depression 09/14/2013   Depression 07/30/2016   Dysrhythmia    palpitations.  no treatment. saw dr. Marcelline Deist and had stress test but no diagnosis   GERD (gastroesophageal reflux  disease)    winding down on antacids   Hyperlipidemia, unspecified 09/14/2013   Hyperthyroidism    Menopausal symptoms 06/10/2016   Overview:  prometrium '200mg'$  daily estrdiol patch 0.'05mg'$ /day q3days   Palpitations 09/14/2013   PONV (postoperative nausea and vomiting)    S/P trigger finger release 10/27/2013   Vitamin D deficiency     Past Medical History, Surgical history, Social history, and Family history were reviewed and updated as appropriate.   Please see review of systems for further details on the patient's review from today.   Objective:   Physical Exam:  There were no vitals taken for this visit.  Physical Exam Constitutional:      General: She is not in acute distress. Musculoskeletal:        General: No deformity.  Neurological:     Mental Status: She is alert and oriented to person, place, and time.     Coordination: Coordination normal.  Psychiatric:        Attention and Perception: Attention and perception normal. She does not perceive auditory or visual hallucinations.        Mood and Affect: Mood normal. Mood is not anxious or depressed. Affect is not labile, blunt, angry or inappropriate.        Speech: Speech normal.        Behavior: Behavior normal.        Thought Content: Thought content normal. Thought content is not paranoid or delusional. Thought content does not include homicidal or suicidal ideation. Thought content does not include homicidal or suicidal plan.        Cognition and Memory: Cognition and memory normal.        Judgment: Judgment normal.     Comments: Insight intact     Lab Review:     Component Value Date/Time   NA 137 07/14/2021 2004   K 3.6 07/14/2021 2004   CL 103 07/14/2021 2004   CO2 21 (L) 07/14/2021 2004   GLUCOSE 198 (H) 07/14/2021 2004   BUN 14 07/14/2021 2004   CREATININE 0.83 07/14/2021 2004   CALCIUM 9.0 07/14/2021 2004   GFRNONAA >60 07/14/2021 2004   GFRAA >60 01/26/2017 1218       Component Value Date/Time    WBC 14.5 (H) 07/14/2021 2004   RBC 4.68 07/14/2021 2004   HGB 13.5 07/14/2021 2004   HCT 40.2 07/14/2021 2004   PLT 320 07/14/2021 2004   MCV 85.9 07/14/2021 2004   MCH 28.8 07/14/2021 2004   MCHC 33.6 07/14/2021 2004   RDW 13.6 07/14/2021 2004    No results found for: "POCLITH", "LITHIUM"   No results found for: "PHENYTOIN", "PHENOBARB", "VALPROATE", "CBMZ"   .res Assessment: Plan:    Plan:  PDMP reviewed  D/C Lexapro '10mg'$  daily - take 1/2 tablet daily for 7 days, then d/c.   Add Pristiq '50mg'$  every morning  Ambien '5mg'$  at hs for sleep  [employer]ight testing  reviewed.   RTC 4 weeks  Patient advised to contact office with any questions, adverse effects, or acute worsening in signs and symptoms.   Diagnoses and all orders for this visit:  Major depressive disorder in partial remission, unspecified whether recurrent ([place])  Generalized anxiety disorder  Obsessional thoughts  Adjustment disorder with depressed mood  Insomnia, unspecified type     Please see After Visit Summary for patient specific instructions.  Future Appointments  Date Time Provider [place]  01/27/2022  3:00 PM Anson [place], [facility] CP-CP None  02/09/2022  5:00 PM Anson [place], [facility] CP-CP None    No orders of the defined types were placed in this encounter.   -------------------------------

## 2022-01-27 ENCOUNTER — Ambulatory Visit (INDEPENDENT_AMBULATORY_CARE_PROVIDER_SITE_OTHER): Payer: BC Managed Care – PPO | Admitting: Adult Health

## 2022-01-27 ENCOUNTER — Encounter: Payer: Self-pay | Admitting: Adult Health

## 2022-01-27 ENCOUNTER — Ambulatory Visit (INDEPENDENT_AMBULATORY_CARE_PROVIDER_SITE_OTHER): Payer: BC Managed Care – PPO | Admitting: Mental Health

## 2022-01-27 DIAGNOSIS — F411 Generalized anxiety disorder: Secondary | ICD-10-CM

## 2022-01-27 DIAGNOSIS — F324 Major depressive disorder, single episode, in partial remission: Secondary | ICD-10-CM

## 2022-01-27 DIAGNOSIS — F4321 Adjustment disorder with depressed mood: Secondary | ICD-10-CM | POA: Diagnosis not present

## 2022-01-27 DIAGNOSIS — G4700 Insomnia, unspecified: Secondary | ICD-10-CM

## 2022-01-27 DIAGNOSIS — F428 Other obsessive-compulsive disorder: Secondary | ICD-10-CM | POA: Diagnosis not present

## 2022-01-27 NOTE — Progress Notes (Signed)
[facility] Psychotherapy Note   Name: Olivia Armstrong Date: 12/18/21 MRN: [id number] DOB: 10-10-1965 PCP: Idelle Crouch, MD  Time spent: 45 minutes  Treatment;  Ind. therapy  Mental Status Exam:    Appearance:    Casual     Behavior:   Appropriate  Motor:   WNL  Speech/Language:    Clear and Coherent  Affect:   Full range   Mood:   Pleasant, sad, tearful  Thought process:   Logical, linear, goal directed  Thought content:     WNL  Sensory/Perceptual disturbances:     none  Orientation:   x4  Attention:   Good  Concentration:   Good  Memory:   Intact  Fund of knowledge:    Consistent with age and development  Insight:     Good  Judgment:    Good  Impulse Control:   Good     Reported Symptoms:     Risk Assessment: Danger to Self:  No Self-injurious Behavior: No Danger to Others: No Duty to Warn:no Physical Aggression / Violence:No  Access to Firearms a concern: No  Gang Involvement:No  Patient / guardian was educated about steps to take if suicide or homicide risk level increases between visits: yes While future psychiatric events cannot be accurately predicted, the patient does not currently require acute inpatient psychiatric care and does not currently meet [facility] involuntary commitment criteria.    Allergies  Allergen Reactions   Kiwi Extract Anaphylaxis    Itchy throat and tongue   Mirtazapine Other (See Comments)    Dizziness    Diagnoses:    ICD-10-CM   1. Major depressive disorder in partial remission, unspecified whether recurrent ([place])  F32.4           Subjective:  Patient arrived a few minutes late for today's session.  She shared recent events where her ex-husband's funeral arrangements have yet to be determined.  She went on to share how his 2 brothers have not followed through with making the arrangements, therefore she took it upon herself to facilitate.  Explored with patient how she continues to adjust to this  loss, they remain friends over the years and she went on to share many details related to trying to be supportive to one of her ex brother-in-law and his wife.  She shared some images from her ex-husband's residence which indicated significant hoarding and decompensation in the home.  Explored her feelings related as this was not consistent with the functioning she thought that he was at prior to his passing. Assisted her in identifying needs where she looks forward to having the ceremony potentially in about 3 weeks to get a sense of closure   Plan:  Patient is to use CBT, mindfulness and coping skills to help manage decrease symptoms associated with their diagnosis.   Long-term goals:   Maintain symptom reduction: The patient will report sustained reduction in symptoms of depression using both CBT and mindfulness interventions. Improve emotional regulation: The patient will learn and apply CBT and mindfulness-based strategies to regulate emotions, such as mindfulness-based stress reduction and cognitive restructuring, and report an improvement in emotional regulation for at least 3 consecutive months progressively.   Short-term goal:  The patient will learn and apply CBT and mindfulness-based coping skills for managing anxiety and practice using it between sessions.       2.   The patient will CBT and mindfulness-based interventions to increase awareness of negative thought patterns and work to  reframe them as needed.       3.   Patient to identify self supportive thoughts improving her self confidence to feeling "I am enough".       4.   Patient to care for herself by getting adequate rest.      Anson [place], [facility]

## 2022-01-27 NOTE — Progress Notes (Signed)
Olivia Armstrong [id number] 26-Jun-1965 56 y.o.  Subjective:   Patient ID:  Olivia Armstrong is a 56 y.o. (DOB 07/22/1965) female.  Chief Complaint: No chief complaint on file.   HPI Olivia Armstrong presents to the office today for follow-up of MDD, GAD, obsessional thoughts and acts, adjustment disorder and insomnia.  Describes mood today as "ok". Pleasant. Tearful "not really". Mood symptoms - reports decreased depression, anxiety and irritability. Reports decreased worry and rumination. Reports some obsessive thoughts - "here and there". Struggles with irrational thinking - "I'm prone to it".  Mood is consistent. Feels like the addition of Pristiq has been helpful. Stable interest and motivation. Taking medications as prescribed. Energy levels improving. Active, does not have a regular exercise routine. Enjoys some usual interests and activities. Divorced. Single, not dating. Lives with cat - "[name]" - 19.5 years and has added a new cat "murphy". Sister in [place] and [place]. Spending time with family. Appetite adequate. Weight stable - 159 pounds. Sleeps well most nights - taking longer to get to sleep. Averages 7 hours. Focus and concentration "a little better". Completing tasks. Managing aspects of household. 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  Denies AH or VH. Denies self harm. Substance use - THC daily.  Previous medication trials: Lexapro, Ambien, Prozac, Trazadone, Zoloft   Flowsheet Row ED from 07/14/2021 in [place] CATEGORY No Risk        Review of Systems:  Review of Systems  Musculoskeletal:  Negative for gait problem.  Neurological:  Negative for tremors.  Psychiatric/Behavioral:         Please refer to HPI    Medications: I have reviewed the patient's current medications.  Current Outpatient Medications  Medication Sig Dispense Refill   Brimonidine Tartrate (LUMIFY)  0.025 % SOLN Place 1 drop into both eyes daily as needed (Itchy eyes).     Dapsone (ACZONE) 7.5 % GEL Apply 1 application topically 2 (two) times a day.      desvenlafaxine (PRISTIQ) 50 MG 24 hr tablet Take 1 tablet (50 mg total) by mouth daily. 90 tablet 1   estradiol (VIVELLE-DOT) 0.05 MG/24HR patch Place 1 patch onto the skin 2 (two) times a week. Change patch on Wed and Sun.     hydrocortisone 2.5 % ointment Apply 1 application topically 2 (two) times daily as needed (itching).      ibuprofen (ADVIL,MOTRIN) 200 MG tablet Take 600 mg by mouth every 6 (six) hours as needed for fever, headache or mild pain.      Nutritional Supplements (CANDIDA COMPLEX PO) Take 2 tablets by mouth daily.     Probiotic Product ([employer] PROBIOTIC MAXIMUM STRENGTH) CAPS Take 1 capsule by mouth daily.      progesterone (PROMETRIUM) 200 MG capsule Take 200 mg by mouth at bedtime.      thyroid (ARMOUR THYROID) 30 MG tablet Take 30 mg by mouth daily before breakfast.      tretinoin (RETIN-A) 0.05 % cream Apply 1 application topically at bedtime as needed (for skin care).     Vitamin D, Ergocalciferol, (DRISDOL) 50000 units CAPS capsule Take 50,000 Units by mouth See admin instructions. Take 1 capsule twice a week on Tuesdays and Saturdays.     Zinc Sulfate (ZINC 15 PO) Take 2 drops by mouth daily.      zolpidem (AMBIEN) 5 MG tablet Take 5 mg by mouth at bedtime.      No current facility-administered  medications for this visit.    Medication Side Effects: None  Allergies:  Allergies  Allergen Reactions   Kiwi Extract Anaphylaxis    Itchy throat and tongue Mouth and throat swelling and itching   Mirtazapine Other (See Comments)    Dizziness    Past Medical History:  Diagnosis Date   Anxiety and depression 09/14/2013   Depression 07/30/2016   Dysrhythmia    palpitations.  no treatment. saw dr. Marcelline Deist and had stress test but no diagnosis   GERD (gastroesophageal reflux disease)    winding down on antacids    Hyperlipidemia, unspecified 09/14/2013   Hyperthyroidism    Menopausal symptoms 06/10/2016   Overview:  prometrium '200mg'$  daily estrdiol patch 0.'05mg'$ /day q3days   Palpitations 09/14/2013   PONV (postoperative nausea and vomiting)    S/P trigger finger release 10/27/2013   Vitamin D deficiency     Past Medical History, Surgical history, Social history, and Family history were reviewed and updated as appropriate.   Please see review of systems for further details on the patient's review from today.   Objective:   Physical Exam:  There were no vitals taken for this visit.  Physical Exam Constitutional:      General: She is not in acute distress. Musculoskeletal:        General: No deformity.  Neurological:     Mental Status: She is alert and oriented to person, place, and time.     Coordination: Coordination normal.  Psychiatric:        Attention and Perception: Attention and perception normal. She does not perceive auditory or visual hallucinations.        Mood and Affect: Mood normal. Mood is not anxious or depressed. Affect is not labile, blunt, angry or inappropriate.        Speech: Speech normal.        Behavior: Behavior normal.        Thought Content: Thought content normal. Thought content is not paranoid or delusional. Thought content does not include homicidal or suicidal ideation. Thought content does not include homicidal or suicidal plan.        Cognition and Memory: Cognition and memory normal.        Judgment: Judgment normal.     Comments: Insight intact     Lab Review:     Component Value Date/Time   NA 137 07/14/2021 2004   K 3.6 07/14/2021 2004   CL 103 07/14/2021 2004   CO2 21 (L) 07/14/2021 2004   GLUCOSE 198 (H) 07/14/2021 2004   BUN 14 07/14/2021 2004   CREATININE 0.83 07/14/2021 2004   CALCIUM 9.0 07/14/2021 2004   GFRNONAA >60 07/14/2021 2004   GFRAA >60 01/26/2017 1218       Component Value Date/Time   WBC 14.5 (H) 07/14/2021 2004   RBC 4.68  07/14/2021 2004   HGB 13.5 07/14/2021 2004   HCT 40.2 07/14/2021 2004   PLT 320 07/14/2021 2004   MCV 85.9 07/14/2021 2004   MCH 28.8 07/14/2021 2004   MCHC 33.6 07/14/2021 2004   RDW 13.6 07/14/2021 2004    No results found for: "POCLITH", "LITHIUM"   No results found for: "PHENYTOIN", "PHENOBARB", "VALPROATE", "CBMZ"   .res Assessment: Plan:    Plan:  PDMP reviewed  Pristiq '50mg'$  every morning  Ambien '5mg'$  at hs for sleep  Genesight testing reviewed.   RTC 4 weeks  Patient advised to contact office with any questions, adverse effects, or acute worsening in signs and symptoms.  There are no diagnoses linked to this encounter.   Please see After Visit Summary for patient specific instructions.  Future Appointments  Date Time Provider [place]  02/09/2022  5:00 PM Anson [place], [facility] CP-CP None  03/16/2022  5:00 PM Anson [place], [facility] CP-CP None    No orders of the defined types were placed in this encounter.   -------------------------------

## 2022-02-09 ENCOUNTER — Ambulatory Visit (INDEPENDENT_AMBULATORY_CARE_PROVIDER_SITE_OTHER): Payer: BC Managed Care – PPO | Admitting: Mental Health

## 2022-02-09 DIAGNOSIS — F324 Major depressive disorder, single episode, in partial remission: Secondary | ICD-10-CM | POA: Diagnosis not present

## 2022-02-09 NOTE — Progress Notes (Signed)
[facility] Psychotherapy Note   Name: Olivia Armstrong Date: 02/09/22 MRN: [id number] DOB: 11-Dec-1965 PCP: Idelle Crouch, MD  Time spent: 54 minutes  Treatment;  Ind. therapy  Mental Status Exam:    Appearance:    Casual     Behavior:   Appropriate  Motor:   WNL  Speech/Language:    Clear and Coherent  Affect:   Full range   Mood:   Pleasant, sad, tearful  Thought process:   Logical, linear, goal directed  Thought content:     WNL  Sensory/Perceptual disturbances:     none  Orientation:   x4  Attention:   Good  Concentration:   Good  Memory:   Intact  Fund of knowledge:    Consistent with age and development  Insight:     Good  Judgment:    Good  Impulse Control:   Good     Reported Symptoms:  anxiety    Risk Assessment: Danger to Self:  No Self-injurious Behavior: No Danger to Others: No Duty to Warn:no Physical Aggression / Violence:No  Access to Firearms a concern: No  Gang Involvement:No  Patient / guardian was educated about steps to take if suicide or homicide risk level increases between visits: yes While future psychiatric events cannot be accurately predicted, the patient does not currently require acute inpatient psychiatric care and does not currently meet [facility] involuntary commitment criteria.    Allergies  Allergen Reactions   Kiwi Extract Anaphylaxis    Itchy throat and tongue Mouth and throat swelling and itching   Mirtazapine Other (See Comments)    Dizziness    Diagnoses:    ICD-10-CM   1. Major depressive disorder in partial remission, unspecified whether recurrent ([place])  F32.4          Subjective:  Patient arrived on time for today's session.  Patient continues to focus on the passing of her ex-husband.  She went on to share family interactions with his family, her attempts to be supportive during this time as well as engage in some problem solving due to the complications that have arisen from the sudden  loss.  She continues to also identify distressful feelings related to the condition of his living situation prior to his passing which she was unaware.  She stated they kept in contact periodically via phone over the years since their divorce which was approximately 14 years ago.  Facilitated her further identifying and processing feelings related to the loss as well as the coming funeral arrangements that are to take place early next month.    Plan:  Patient is to use CBT, mindfulness and coping skills to help manage decrease symptoms associated with their diagnosis.   Long-term goals:   Maintain symptom reduction: The patient will report sustained reduction in symptoms of depression using both CBT and mindfulness interventions. Improve emotional regulation: The patient will learn and apply CBT and mindfulness-based strategies to regulate emotions, such as mindfulness-based stress reduction and cognitive restructuring, and report an improvement in emotional regulation for at least 3 consecutive months progressively.   Short-term goal:  The patient will learn and apply CBT and mindfulness-based coping skills for managing anxiety and practice using it between sessions.       2.   The patient will CBT and mindfulness-based interventions to increase awareness of negative thought patterns and work to reframe them as needed.       3.   Patient to identify self supportive thoughts improving  her self confidence to feeling "I am enough".       4.   Patient to care for herself by getting adequate rest.      Anson [place], [facility]

## 2022-03-16 ENCOUNTER — Ambulatory Visit (INDEPENDENT_AMBULATORY_CARE_PROVIDER_SITE_OTHER): Payer: BC Managed Care – PPO | Admitting: Mental Health

## 2022-03-16 DIAGNOSIS — F324 Major depressive disorder, single episode, in partial remission: Secondary | ICD-10-CM | POA: Diagnosis not present

## 2022-03-16 NOTE — Progress Notes (Signed)
[facility] Psychotherapy Note   Name: WHITLEY PATCHEN Date: 02/09/22 MRN: [id number] DOB: 05-Jun-1965 PCP: Idelle Crouch, MD  Time spent: 57 minutes  Treatment;  Ind. therapy  Mental Status Exam:    Appearance:    Casual     Behavior:   Appropriate  Motor:   WNL  Speech/Language:    Clear and Coherent  Affect:   Full range   Mood:   Pleasant, sad, tearful  Thought process:   Logical, linear, goal directed  Thought content:     WNL  Sensory/Perceptual disturbances:     none  Orientation:   x4  Attention:   Good  Concentration:   Good  Memory:   Intact  Fund of knowledge:    Consistent with age and development  Insight:     Good  Judgment:    Good  Impulse Control:   Good     Reported Symptoms:  anxiety    Risk Assessment: Danger to Self:  No Self-injurious Behavior: No Danger to Others: No Duty to Warn:no Physical Aggression / Violence:No  Access to Firearms a concern: No  Gang Involvement:No  Patient / guardian was educated about steps to take if suicide or homicide risk level increases between visits: yes While future psychiatric events cannot be accurately predicted, the patient does not currently require acute inpatient psychiatric care and does not currently meet [facility] involuntary commitment criteria.    Allergies  Allergen Reactions   Kiwi Extract Anaphylaxis    Itchy throat and tongue Mouth and throat swelling and itching   Mirtazapine Other (See Comments)    Dizziness    Diagnoses:    ICD-10-CM   1. Major depressive disorder in partial remission, unspecified whether recurrent ([place])  F32.4           Subjective:  Patient arrived on time for today's session.  Assess progress since last visit.  Patient shared how she has been coping with some increased work related stress, going on to provide details.  She stated that she got upset recently due to feeling overwhelmed but was able to receive some support from a coworker.   She stated she typically does better at work when there is tasks that are more short-term versus long-term projects as it affects her ability to maintain focus with less distractibility.  She went on to share experiences and ongoing emotions related to the loss of her ex-husband who passed away about 2 months ago, stating that they had a funeral recently and she came to understand other aspects of his life that she had not initially known. Assessed symptoms, her anxiety and tendencies to ruminate where she stated she is in working to try and manage.  She shared past incidences where she can tend to ruminate based in part due to her past feelings of being excluded or rejected by others which played a role circumstantially and the example she shared in session today.  We plan to further explore next visit. . Plan:  Patient is to use CBT, mindfulness and coping skills to help manage decrease symptoms associated with their diagnosis.   Long-term goals:   Maintain symptom reduction: The patient will report sustained reduction in symptoms of depression using both CBT and mindfulness interventions. Improve emotional regulation: The patient will learn and apply CBT and mindfulness-based strategies to regulate emotions, such as mindfulness-based stress reduction and cognitive restructuring, and report an improvement in emotional regulation for at least 3 consecutive months progressively.   Short-term  goal:  The patient will learn and apply CBT and mindfulness-based coping skills for managing anxiety and practice using it between sessions.       2.   The patient will CBT and mindfulness-based interventions to increase awareness of negative thought patterns and work to reframe them as needed.       3.   Patient to identify self supportive thoughts improving her self confidence to feeling "I am enough".       4.   Patient to care for herself by getting adequate rest.      Anson [place], [facility]

## 2022-04-07 ENCOUNTER — Ambulatory Visit: Payer: BC Managed Care – PPO | Admitting: Adult Health

## 2022-04-08 ENCOUNTER — Other Ambulatory Visit: Payer: Self-pay | Admitting: Internal Medicine

## 2022-04-08 DIAGNOSIS — Z1231 Encounter for screening mammogram for malignant neoplasm of breast: Secondary | ICD-10-CM

## 2022-05-04 ENCOUNTER — Ambulatory Visit: Payer: BC Managed Care – PPO | Admitting: Adult Health

## 2022-05-07 ENCOUNTER — Ambulatory Visit
Admission: RE | Admit: 2022-05-07 | Discharge: 2022-05-07 | Disposition: A | Discharge status: Home / Self Care | Payer: BC Managed Care – PPO | Source: Ambulatory Visit | Attending: Internal Medicine | Admitting: Internal Medicine

## 2022-05-07 ENCOUNTER — Ambulatory Visit (INDEPENDENT_AMBULATORY_CARE_PROVIDER_SITE_OTHER): Payer: BC Managed Care – PPO | Admitting: Adult Health

## 2022-05-07 ENCOUNTER — Encounter: Payer: Self-pay | Admitting: Adult Health

## 2022-05-07 DIAGNOSIS — G4700 Insomnia, unspecified: Secondary | ICD-10-CM | POA: Diagnosis not present

## 2022-05-07 DIAGNOSIS — F324 Major depressive disorder, single episode, in partial remission: Secondary | ICD-10-CM

## 2022-05-07 DIAGNOSIS — Z1231 Encounter for screening mammogram for malignant neoplasm of breast: Secondary | ICD-10-CM | POA: Diagnosis present

## 2022-05-07 DIAGNOSIS — F428 Other obsessive-compulsive disorder: Secondary | ICD-10-CM | POA: Diagnosis not present

## 2022-05-07 DIAGNOSIS — F411 Generalized anxiety disorder: Secondary | ICD-10-CM | POA: Diagnosis not present

## 2022-05-07 MED ORDER — DESVENLAFAXINE SUCCINATE ER 50 MG PO TB24: 50.0000 mg | ORAL_TABLET | Freq: Every day | ORAL | 1 refills | Status: DC

## 2022-05-07 NOTE — Progress Notes (Signed)
Olivia Armstrong [id number] 11/13/1965 57 y.o.  Subjective:   Patient ID:  Olivia Armstrong is a 57 y.o. (DOB [birth year]) female.  Chief Complaint: No chief complaint on file.   HPI Olivia Armstrong presents to the office today for follow-up of MDD, GAD, obsessional thoughts and acts, adjustment disorder and insomnia.  Describes mood today as "ok". Pleasant. Denies tearfulness. Mood symptoms - denies depression, anxiety and irritability. Denies worry and rumination. Denies obsessive thoughts. Decreased irrational thinking. Mood is consistent. Stating "I feel much better". Feels like the Pristiq is helpful. Stable interest and motivation. Taking medications as prescribed. Energy levels improving. Active, does not have a regular exercise routine. Enjoys some usual interests and activities. Divorced. Single, not dating. Lives with cat - "[name]" - 19.5 and  "[name]" - 46. Sister in [place] and [place]. Spending time with family. Appetite adequate. Weight stable - 158 pounds. Sleeps well most nights. Averages 7 hours. Focus and concentration "better". Completing tasks. Managing aspects of household. 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  Denies AH or VH. Denies self harm. Substance use - THC daily.  Previous medication trials: Lexapro, Ambien, Prozac, Trazadone, Zoloft   Flowsheet Row ED from 07/14/2021 in [facility] CATEGORY No Risk        Review of Systems:  Review of Systems  Musculoskeletal:  Negative for gait problem.  Neurological:  Negative for tremors.  Psychiatric/Behavioral:         Please refer to HPI    Medications: I have reviewed the patient's current medications.  Current Outpatient Medications  Medication Sig Dispense Refill   Brimonidine Tartrate (LUMIFY) 0.025 % SOLN Place 1 drop into both eyes daily as needed (Itchy eyes).     Dapsone (ACZONE) 7.5 % GEL Apply 1 application  topically 2 (two) times a day.      desvenlafaxine (PRISTIQ) 50 MG 24 hr tablet Take 1 tablet (50 mg total) by mouth daily. 90 tablet 1   estradiol (VIVELLE-DOT) 0.05 MG/24HR patch Place 1 patch onto the skin 2 (two) times a week. Change patch on Wed and Sun.     hydrocortisone 2.5 % ointment Apply 1 application topically 2 (two) times daily as needed (itching).      ibuprofen (ADVIL,MOTRIN) 200 MG tablet Take 600 mg by mouth every 6 (six) hours as needed for fever, headache or mild pain.      Nutritional Supplements (CANDIDA COMPLEX PO) Take 2 tablets by mouth daily.     Probiotic Product ([employer] PROBIOTIC MAXIMUM STRENGTH) CAPS Take 1 capsule by mouth daily.      progesterone (PROMETRIUM) 200 MG capsule Take 200 mg by mouth at bedtime.      thyroid (ARMOUR THYROID) 30 MG tablet Take 30 mg by mouth daily before breakfast.      tretinoin (RETIN-A) 0.05 % cream Apply 1 application topically at bedtime as needed (for skin care).     Vitamin D, Ergocalciferol, (DRISDOL) 50000 units CAPS capsule Take 50,000 Units by mouth See admin instructions. Take 1 capsule twice a week on Tuesdays and Saturdays.     Zinc Sulfate (ZINC 15 PO) Take 2 drops by mouth daily.      zolpidem (AMBIEN) 5 MG tablet Take 5 mg by mouth at bedtime.      No current facility-administered medications for this visit.    Medication Side Effects: None  Allergies:  Allergies  Allergen Reactions   Kiwi Extract Anaphylaxis  Itchy throat and tongue Mouth and throat swelling and itching   Mirtazapine Other (See Comments)    Dizziness    Past Medical History:  Diagnosis Date   Anxiety and depression 09/14/2013   Depression 07/30/2016   Dysrhythmia    palpitations.  no treatment. saw dr. Marcelline Deist and had stress test but no diagnosis   GERD (gastroesophageal reflux disease)    winding down on antacids   Hyperlipidemia, unspecified 09/14/2013   Hyperthyroidism    Menopausal symptoms 06/10/2016   Overview:  prometrium '200mg'$   daily estrdiol patch 0.'05mg'$ /day q3days   Palpitations 09/14/2013   PONV (postoperative nausea and vomiting)    S/P trigger finger release 10/27/2013   Vitamin D deficiency     Past Medical History, Surgical history, Social history, and Family history were reviewed and updated as appropriate.   Please see review of systems for further details on the patient's review from today.   Objective:   Physical Exam:  There were no vitals taken for this visit.  Physical Exam Constitutional:      General: She is not in acute distress. Musculoskeletal:        General: No deformity.  Neurological:     Mental Status: She is alert and oriented to person, place, and time.     Coordination: Coordination normal.  Psychiatric:        Attention and Perception: Attention and perception normal. She does not perceive auditory or visual hallucinations.        Mood and Affect: Mood normal. Mood is not anxious or depressed. Affect is not labile, blunt, angry or inappropriate.        Speech: Speech normal.        Behavior: Behavior normal.        Thought Content: Thought content normal. Thought content is not paranoid or delusional. Thought content does not include homicidal or suicidal ideation. Thought content does not include homicidal or suicidal plan.        Cognition and Memory: Cognition and memory normal.        Judgment: Judgment normal.     Comments: Insight intact     Lab Review:     Component Value Date/Time   NA 137 07/14/2021 2004   K 3.6 07/14/2021 2004   CL 103 07/14/2021 2004   CO2 21 (L) 07/14/2021 2004   GLUCOSE 198 (H) 07/14/2021 2004   BUN 14 07/14/2021 2004   CREATININE 0.83 07/14/2021 2004   CALCIUM 9.0 07/14/2021 2004   GFRNONAA >60 07/14/2021 2004   GFRAA >60 01/26/2017 1218       Component Value Date/Time   WBC 14.5 (H) 07/14/2021 2004   RBC 4.68 07/14/2021 2004   HGB 13.5 07/14/2021 2004   HCT 40.2 07/14/2021 2004   PLT 320 07/14/2021 2004   MCV 85.9 07/14/2021  2004   MCH 28.8 07/14/2021 2004   MCHC 33.6 07/14/2021 2004   RDW 13.6 07/14/2021 2004    No results found for: "POCLITH", "LITHIUM"   No results found for: "PHENYTOIN", "PHENOBARB", "VALPROATE", "CBMZ"   .res Assessment: Plan:    Plan:  PDMP reviewed  Pristiq '50mg'$  every morning Ambien '5mg'$  at hs for sleep  Genesight testing reviewed.   RTC 4 weeks  Patient advised to contact office with any questions, adverse effects, or acute worsening in signs and symptoms.  There are no diagnoses linked to this encounter.   Please see After Visit Summary for patient specific instructions.  No future appointments.  No orders  of the defined types were placed in this encounter.   -------------------------------

## 2022-08-06 ENCOUNTER — Ambulatory Visit: Payer: BC Managed Care – PPO | Admitting: Adult Health

## 2022-08-13 ENCOUNTER — Other Ambulatory Visit: Payer: Self-pay

## 2022-08-13 ENCOUNTER — Telehealth: Payer: Self-pay

## 2022-08-13 DIAGNOSIS — Z86010 Personal history of colonic polyps: Secondary | ICD-10-CM

## 2022-08-13 MED ORDER — NA SULFATE-K SULFATE-MG SULF 17.5-3.13-1.6 GM/177ML PO SOLN: 1.0000 | Freq: Once | ORAL | 0 refills | Status: AC

## 2022-08-13 NOTE — Telephone Encounter (Signed)
Gastroenterology Pre-Procedure Review  Request Date: 10/30/22 Requesting Physician: Dr. Servando Snare  PATIENT REVIEW QUESTIONS: The patient responded to the following health history questions as indicated:    1. Are you having any GI issues? no 2. Do you have a personal history of Polyps? yes (Last colonoscopy 10/15/17 performed by Dr. Servando Snare) 3. Do you have a family history of Colon Cancer or Polyps? no 4. Diabetes Mellitus? no 5. Joint replacements in the past 12 months?no 6. Major health problems in the past 3 months?no 7. Any artificial heart valves, MVP, or defibrillator?no    MEDICATIONS & ALLERGIES:    Patient reports the following regarding taking any anticoagulation/antiplatelet therapy:   Plavix, Coumadin, Eliquis, Xarelto, Lovenox, Pradaxa, Brilinta, or Effient? no Aspirin? no  Patient confirms/reports the following medications:  Current Outpatient Medications  Medication Sig Dispense Refill   Na Sulfate-K Sulfate-Mg Sulf 17.5-3.13-1.6 GM/177ML SOLN Take 1 kit by mouth once for 1 dose. 354 mL 0   Brimonidine Tartrate (LUMIFY) 0.025 % SOLN Place 1 drop into both eyes daily as needed (Itchy eyes).     Dapsone (ACZONE) 7.5 % GEL Apply 1 application topically 2 (two) times a day.      desvenlafaxine (PRISTIQ) 50 MG 24 hr tablet Take 1 tablet (50 mg total) by mouth daily. 90 tablet 1   estradiol (VIVELLE-DOT) 0.05 MG/24HR patch Place 1 patch onto the skin 2 (two) times a week. Change patch on Wed and Sun.     hydrocortisone 2.5 % ointment Apply 1 application topically 2 (two) times daily as needed (itching).      ibuprofen (ADVIL,MOTRIN) 200 MG tablet Take 600 mg by mouth every 6 (six) hours as needed for fever, headache or mild pain.      Nutritional Supplements (CANDIDA COMPLEX PO) Take 2 tablets by mouth daily.     Probiotic Product ([employer]S PROBIOTIC MAXIMUM STRENGTH) CAPS Take 1 capsule by mouth daily.      progesterone (PROMETRIUM) 200 MG capsule Take 200 mg by mouth at bedtime.       thyroid (ARMOUR THYROID) 30 MG tablet Take 30 mg by mouth daily before breakfast.      tretinoin (RETIN-A) 0.05 % cream Apply 1 application topically at bedtime as needed (for skin care).     Vitamin D, Ergocalciferol, (DRISDOL) 50000 units CAPS capsule Take 50,000 Units by mouth See admin instructions. Take 1 capsule twice a week on Tuesdays and Saturdays.     Zinc Sulfate (ZINC 15 PO) Take 2 drops by mouth daily.      zolpidem (AMBIEN) 5 MG tablet Take 5 mg by mouth at bedtime.      No current facility-administered medications for this visit.    Patient confirms/reports the following allergies:  Allergies  Allergen Reactions   Kiwi Extract Anaphylaxis    Itchy throat and tongue Mouth and throat swelling and itching   Mirtazapine Other (See Comments)    Dizziness    No orders of the defined types were placed in this encounter.   AUTHORIZATION INFORMATION Primary Insurance: 1D#: Group #:  Secondary Insurance: 1D#: Group #:  SCHEDULE INFORMATION: Date: 10/30/22 Time: Location: [facility]

## 2022-08-20 ENCOUNTER — Encounter: Payer: Self-pay | Admitting: Adult Health

## 2022-08-20 ENCOUNTER — Ambulatory Visit (INDEPENDENT_AMBULATORY_CARE_PROVIDER_SITE_OTHER): Payer: BC Managed Care – PPO | Admitting: Adult Health

## 2022-08-20 DIAGNOSIS — F428 Other obsessive-compulsive disorder: Secondary | ICD-10-CM | POA: Diagnosis not present

## 2022-08-20 DIAGNOSIS — F324 Major depressive disorder, single episode, in partial remission: Secondary | ICD-10-CM

## 2022-08-20 DIAGNOSIS — F411 Generalized anxiety disorder: Secondary | ICD-10-CM

## 2022-08-20 DIAGNOSIS — G4700 Insomnia, unspecified: Secondary | ICD-10-CM

## 2022-08-20 NOTE — Progress Notes (Signed)
Olivia Armstrong [id number] [birth year] 57 y.o.  Subjective:   Patient ID:  Olivia Armstrong is a 57 y.o. (DOB Dec 03, 1965) female.  Chief Complaint: No chief complaint on file.   HPI Olivia Armstrong presents to the office today for follow-up of MDD, GAD, obsessional thoughts and acts, adjustment disorder and insomnia.  Describes mood today as "ok". Pleasant. Denies tearfulness. Mood symptoms - reports some depression, anxiety and irritability with recent loss of cat. Reports decreased worry, rumination, and over thinking. Denies obsessive thoughts. Decreased irrational thinking. Mood is consistent. Stating "I feel like I'm doing ok". Feels like the Pristiq is helpful. Stable interest and motivation. Taking medications as prescribed. Energy levels lower. Active, does not have a regular exercise routine. Enjoys some usual interests and activities. Divorced. Single, not dating. Lives with cat "Eulah Pont" - 17. Recently lost cat "Ree Kida", age 98. Sister in [place] and [place]. Spending time with family. Appetite adequate. Weight stable - 160 pounds. Sleeps well most nights. Averages 7 hours. Focus and concentration "better". Completing tasks. Managing aspects of household. Works full-time - Teacher, English as a foreign language. Denies SI or HI.  Denies AH or VH. Denies self harm. Substance use - THC daily.  Previous medication trials: Lexapro, Ambien, Prozac, Trazadone, Zoloft     Flowsheet Row ED from 07/14/2021 in [facility] at [facility]  C-[facility] RISK CATEGORY No Risk        Review of Systems:  Review of Systems  Musculoskeletal:  Negative for gait problem.  Neurological:  Negative for tremors.  Psychiatric/Behavioral:         Please refer to HPI    Medications: I have reviewed the patient's current medications.  Current Outpatient Medications  Medication Sig Dispense Refill   Brimonidine Tartrate (LUMIFY) 0.025 % SOLN Place 1 drop into both eyes daily as  needed (Itchy eyes).     Dapsone (ACZONE) 7.5 % GEL Apply 1 application topically 2 (two) times a day.      desvenlafaxine (PRISTIQ) 50 MG 24 hr tablet Take 1 tablet (50 mg total) by mouth daily. 90 tablet 1   estradiol (VIVELLE-DOT) 0.05 MG/24HR patch Place 1 patch onto the skin 2 (two) times a week. Change patch on Wed and Sun.     hydrocortisone 2.5 % ointment Apply 1 application topically 2 (two) times daily as needed (itching).      ibuprofen (ADVIL,MOTRIN) 200 MG tablet Take 600 mg by mouth every 6 (six) hours as needed for fever, headache or mild pain.      Nutritional Supplements (CANDIDA COMPLEX PO) Take 2 tablets by mouth daily.     Probiotic Product ([employer]S PROBIOTIC MAXIMUM STRENGTH) CAPS Take 1 capsule by mouth daily.      progesterone (PROMETRIUM) 200 MG capsule Take 200 mg by mouth at bedtime.      thyroid (ARMOUR THYROID) 30 MG tablet Take 30 mg by mouth daily before breakfast.      tretinoin (RETIN-A) 0.05 % cream Apply 1 application topically at bedtime as needed (for skin care).     Vitamin D, Ergocalciferol, (DRISDOL) 50000 units CAPS capsule Take 50,000 Units by mouth See admin instructions. Take 1 capsule twice a week on Tuesdays and Saturdays.     Zinc Sulfate (ZINC 15 PO) Take 2 drops by mouth daily.      zolpidem (AMBIEN) 5 MG tablet Take 5 mg by mouth at bedtime.      No current facility-administered medications for this visit.    Medication Side Effects:  None  Allergies:  Allergies  Allergen Reactions   Kiwi Extract Anaphylaxis    Itchy throat and tongue Mouth and throat swelling and itching   Mirtazapine Other (See Comments)    Dizziness    Past Medical History:  Diagnosis Date   Anxiety and depression 09/14/2013   Depression 07/30/2016   Dysrhythmia    palpitations.  no treatment. saw dr. Elijah Birk and had stress test but no diagnosis   GERD (gastroesophageal reflux disease)    winding down on antacids   Hyperlipidemia, unspecified 09/14/2013    Hyperthyroidism    Menopausal symptoms 06/10/2016   Overview:  prometrium 200mg  daily estrdiol patch 0.05mg /day q3days   Palpitations 09/14/2013   PONV (postoperative nausea and vomiting)    S/P trigger finger release 10/27/2013   Vitamin D deficiency     Past Medical History, Surgical history, Social history, and Family history were reviewed and updated as appropriate.   Please see review of systems for further details on the patient's review from today.   Objective:   Physical Exam:  There were no vitals taken for this visit.  Physical Exam Constitutional:      General: She is not in acute distress. Musculoskeletal:        General: No deformity.  Neurological:     Mental Status: She is alert and oriented to person, place, and time.     Coordination: Coordination normal.  Psychiatric:        Attention and Perception: Attention and perception normal. She does not perceive auditory or visual hallucinations.        Mood and Affect: Mood normal. Mood is not anxious or depressed. Affect is not labile, blunt, angry or inappropriate.        Speech: Speech normal.        Behavior: Behavior normal.        Thought Content: Thought content normal. Thought content is not paranoid or delusional. Thought content does not include homicidal or suicidal ideation. Thought content does not include homicidal or suicidal plan.        Cognition and Memory: Cognition and memory normal.        Judgment: Judgment normal.     Comments: Insight intact     Lab Review:     Component Value Date/Time   NA 137 07/14/2021 2004   K 3.6 07/14/2021 2004   CL 103 07/14/2021 2004   CO2 21 (L) 07/14/2021 2004   GLUCOSE 198 (H) 07/14/2021 2004   BUN 14 07/14/2021 2004   CREATININE 0.83 07/14/2021 2004   CALCIUM 9.0 07/14/2021 2004   GFRNONAA >60 07/14/2021 2004   GFRAA >60 01/26/2017 1218       Component Value Date/Time   WBC 14.5 (H) 07/14/2021 2004   RBC 4.68 07/14/2021 2004   HGB 13.5 07/14/2021  2004   HCT 40.2 07/14/2021 2004   PLT 320 07/14/2021 2004   MCV 85.9 07/14/2021 2004   MCH 28.8 07/14/2021 2004   MCHC 33.6 07/14/2021 2004   RDW 13.6 07/14/2021 2004    No results found for: "POCLITH", "LITHIUM"   No results found for: "PHENYTOIN", "PHENOBARB", "VALPROATE", "CBMZ"   .res Assessment: Plan:    Plan:  PDMP reviewed  Pristiq 50mg  every morning Ambien 5mg  at hs for sleep  Genesight testing reviewed.   RTC 3 months  Patient advised to contact office with any questions, adverse effects, or acute worsening in signs and symptoms.  Diagnoses and all orders for this visit:  Major depressive  disorder in partial remission, unspecified whether recurrent (HCC)  Generalized anxiety disorder  Obsessional thoughts  Insomnia, unspecified type     Please see After Visit Summary for patient specific instructions.  No future appointments.   No orders of the defined types were placed in this encounter.   -------------------------------

## 2022-09-16 ENCOUNTER — Other Ambulatory Visit: Payer: Self-pay | Admitting: Surgery

## 2022-09-29 ENCOUNTER — Encounter
Admission: RE | Admit: 2022-09-29 | Discharge: 2022-09-29 | Disposition: A | Discharge status: Home / Self Care | Payer: BC Managed Care – PPO | Source: Ambulatory Visit | Attending: Surgery | Admitting: Surgery

## 2022-09-29 ENCOUNTER — Other Ambulatory Visit: Payer: Self-pay

## 2022-09-29 VITALS — Ht 63.0 in | Wt 160.0 lb

## 2022-09-29 DIAGNOSIS — E785 Hyperlipidemia, unspecified: Secondary | ICD-10-CM

## 2022-09-29 DIAGNOSIS — R002 Palpitations: Secondary | ICD-10-CM

## 2022-09-29 DIAGNOSIS — R7303 Prediabetes: Secondary | ICD-10-CM

## 2022-09-29 HISTORY — DX: Prediabetes: R73.03

## 2022-09-29 NOTE — Patient Instructions (Addendum)
Your procedure is scheduled on: Wednesday 10/07/22 To find out your arrival time, please call [phone] between 1PM - 3PM on:   Tuesday 10/06/22 Report to the Registration Desk on the 1st floor of the [facility]. Free Valet parking is available.  If your arrival time is 6:00 am, do not arrive before that time as the [facility] entrance doors do not open until 6:00 am.  REMEMBER: Instructions that are not followed completely may result in serious medical risk, up to and including death; or upon the discretion of your surgeon and anesthesiologist your surgery may need to be rescheduled.  Do not eat food after midnight the night before surgery.  No gum chewing or hard candies.  You may however, drink CLEAR liquids up to 2 hours before you are scheduled to arrive for your surgery. Do not drink anything within 2 hours of your scheduled arrival time.  Clear liquids include: - water  - apple juice without pulp - gatorade (not RED colors) - black coffee or tea (Do NOT add milk or creamers to the coffee or tea) Do NOT drink anything that is not on this list.  Type 1 and Type 2 diabetics should only drink water.  In addition, your doctor has ordered for you to drink the provided:  Ensure Pre-Surgery Clear Carbohydrate Drink  Drinking this carbohydrate drink up to two hours before surgery helps to reduce insulin resistance and improve patient outcomes. Please complete drinking 2 hours before scheduled arrival time.  One week prior to surgery: Stop Anti-inflammatories (NSAIDS) such as Advil, Aleve, Ibuprofen, Motrin, Naproxen, Naprosyn and Aspirin based products such as Excedrin, Goody's Powder, BC Powder. You may however, continue to take Tylenol if needed for pain up until the day of surgery.  Stop ANY OVER THE COUNTER supplements until after surgery.  Continue taking all prescribed medications.   TAKE ONLY THESE MEDICATIONS THE MORNING OF SURGERY WITH A SIP OF  WATER:  desvenlafaxine (PRISTIQ)  thyroid (ARMOUR THYROID)   No Alcohol for 24 hours before or after surgery.  No Smoking including e-cigarettes for 24 hours before surgery.  No chewable tobacco products for at least 6 hours before surgery.  No nicotine patches on the day of surgery.  Do not use any "recreational" drugs for at least a week (preferably 2 weeks) before your surgery.  Please be advised that the combination of cocaine and anesthesia may have negative outcomes, up to and including death. If you test positive for cocaine, your surgery will be cancelled.  On the morning of surgery brush your teeth with toothpaste and water, you may rinse your mouth with mouthwash if you wish. Do not swallow any toothpaste or mouthwash.  Use CHG Soap or wipes as directed on instruction sheet.  Do not wear lotions, powders, or perfumes.   Do not shave body hair from the neck down 48 hours before surgery.  Wear comfortable clothing (specific to your surgery type) to the hospital.  Do not wear jewelry, make-up, hairpins, clips or nail polish.  Contact lenses, hearing aids and dentures may not be worn into surgery. Bring case for glasses  Do not bring valuables to the hospital. [facility] is not responsible for any missing/lost belongings or valuables.   Notify your doctor if there is any change in your medical condition (cold, fever, infection).  If you are being discharged the day of surgery, you will not be allowed to drive home. You will need a responsible individual to drive you home  and stay with you for 24 hours after surgery.   If you are taking public transportation, you will need to have a responsible individual with you.  If you are being admitted to the hospital overnight, leave your suitcase in the car. After surgery it may be brought to your room.  In case of increased patient census, it may be necessary for you, the patient, to continue your postoperative care in the Same  Day Surgery department.  After surgery, you can help prevent lung complications by doing breathing exercises.  Take deep breaths and cough every 1-2 hours. Your doctor may order a device called an Incentive Spirometer to help you take deep breaths. When coughing or sneezing, hold a pillow firmly against your incision with both hands. This is called "splinting." Doing this helps protect your incision. It also decreases belly discomfort.  Surgery Visitation Policy:  Patients undergoing a surgery or procedure may have two family members or support persons with them as long as the person is not COVID-19 positive or experiencing its symptoms.   Inpatient Visitation:    Visiting hours are 7 a.m. to 8 p.m. Up to four visitors are allowed at one time in a patient room. The visitors may rotate out with other people during the day. One designated support person (adult) may remain overnight.  Please call the Pre-admissions Testing Dept. at [phone] if you have any questions about these instructions.     Preparing for Surgery with CHLORHEXIDINE GLUCONATE (CHG) Soap  Chlorhexidine Gluconate (CHG) Soap  o An antiseptic cleaner that kills germs and bonds with the skin to continue killing germs even after washing  o Used for showering the night before surgery and morning of surgery  Before surgery, you can play an important role by reducing the number of germs on your skin.  CHG (Chlorhexidine gluconate) soap is an antiseptic cleanser which kills germs and bonds with the skin to continue killing germs even after washing.  Please do not use if you have an allergy to CHG or antibacterial soaps. If your skin becomes reddened/irritated stop using the CHG.  1. Shower the NIGHT BEFORE SURGERY and the MORNING OF SURGERY with CHG soap.  2. If you choose to wash your hair, wash your hair first as usual with your normal shampoo.  3. After shampooing, rinse your hair and body thoroughly to remove the  shampoo.  4. Use CHG as you would any other liquid soap. You can apply CHG directly to the skin and wash gently with a scrungie or a clean washcloth.  5. Apply the CHG soap to your body only from the neck down. Do not use on open wounds or open sores. Avoid contact with your eyes, ears, mouth, and genitals (private parts). Wash face and genitals (private parts) with your normal soap.  6. Wash thoroughly, paying special attention to the area where your surgery will be performed.  7. Thoroughly rinse your body with warm water.  8. Do not shower/wash with your normal soap after using and rinsing off the CHG soap.  9. Pat yourself dry with a clean towel.  10. Wear clean pajamas to bed the night before surgery.  12. Place clean sheets on your bed the night of your first shower and do not sleep with pets.  13. Shower again with the CHG soap on the day of surgery prior to arriving at the hospital.  14. Do not apply any deodorants/lotions/powders.  15. Please wear clean clothes to the hospital.  How to Use an Incentive Spirometer  An incentive spirometer is a tool that measures how well you are filling your lungs with each breath. Learning to take long, deep breaths using this tool can help you keep your lungs clear and active. This may help to reverse or lessen your chance of developing breathing (pulmonary) problems, especially infection. You may be asked to use a spirometer: After a surgery. If you have a lung problem or a history of smoking. After a long period of time when you have been unable to move or be active. If the spirometer includes an indicator to show the highest number that you have reached, your health care provider or respiratory therapist will help you set a goal. Keep a log of your progress as told by your health care provider. What are the risks? Breathing too quickly may cause dizziness or cause you to pass out. Take your time so you do not get dizzy or  light-headed. If you are in pain, you may need to take pain medicine before doing incentive spirometry. It is harder to take a deep breath if you are having pain. How to use your incentive spirometer  Sit up on the edge of your bed or on a chair. Hold the incentive spirometer so that it is in an upright position. Before you use the spirometer, breathe out normally. Place the mouthpiece in your mouth. Make sure your lips are closed tightly around it. Breathe in slowly and as deeply as you can through your mouth, causing the piston or the ball to rise toward the top of the chamber. Hold your breath for 3-5 seconds, or for as long as possible. If the spirometer includes a coach indicator, use this to guide you in breathing. Slow down your breathing if the indicator goes above the marked areas. Remove the mouthpiece from your mouth and breathe out normally. The piston or ball will return to the bottom of the chamber. Rest for a few seconds, then repeat the steps 10 or more times. Take your time and take a few normal breaths between deep breaths so that you do not get dizzy or light-headed. Do this every 1-2 hours when you are awake. If the spirometer includes a goal marker to show the highest number you have reached (best effort), use this as a goal to work toward during each repetition. After each set of 10 deep breaths, cough a few times. This will help to make sure that your lungs are clear. If you have an incision on your chest or abdomen from surgery, place a pillow or a rolled-up towel firmly against the incision when you cough. This can help to reduce pain while taking deep breaths and coughing. General tips When you are able to get out of bed: Walk around often. Continue to take deep breaths and cough in order to clear your lungs. Keep using the incentive spirometer until your health care provider says it is okay to stop using it. If you have been in the hospital, you may be told to keep  using the spirometer at home. Contact a health care provider if: You are having difficulty using the spirometer. You have trouble using the spirometer as often as instructed. Your pain medicine is not giving enough relief for you to use the spirometer as told. You have a fever. Get help right away if: You develop shortness of breath. You develop a cough with bloody mucus from the lungs. You have fluid or blood coming from  an incision site after you cough. Summary An incentive spirometer is a tool that can help you learn to take long, deep breaths to keep your lungs clear and active. You may be asked to use a spirometer after a surgery, if you have a lung problem or a history of smoking, or if you have been inactive for a long period of time. Use your incentive spirometer as instructed every 1-2 hours while you are awake. If you have an incision on your chest or abdomen, place a pillow or a rolled-up towel firmly against your incision when you cough. This will help to reduce pain. Get help right away if you have shortness of breath, you cough up bloody mucus, or blood comes from your incision when you cough. This information is not intended to replace advice given to you by your health care provider. Make sure you discuss any questions you have with your health care provider. Document Revised: 06/26/2019 Document Reviewed: 06/26/2019 Elsevier Patient Education  [place].

## 2022-10-02 ENCOUNTER — Encounter
Admission: RE | Admit: 2022-10-02 | Discharge: 2022-10-02 | Disposition: A | Discharge status: Home / Self Care | Payer: BC Managed Care – PPO | Source: Ambulatory Visit | Attending: Surgery | Admitting: Surgery

## 2022-10-02 ENCOUNTER — Encounter: Payer: Self-pay | Admitting: Urgent Care

## 2022-10-02 DIAGNOSIS — Z01818 Encounter for other preprocedural examination: Secondary | ICD-10-CM | POA: Insufficient documentation

## 2022-10-02 DIAGNOSIS — R7303 Prediabetes: Secondary | ICD-10-CM | POA: Insufficient documentation

## 2022-10-02 DIAGNOSIS — R002 Palpitations: Secondary | ICD-10-CM | POA: Diagnosis not present

## 2022-10-02 DIAGNOSIS — E785 Hyperlipidemia, unspecified: Secondary | ICD-10-CM | POA: Diagnosis not present

## 2022-10-02 LAB — CBC
HCT: 41 % (ref 36.0–46.0)
Hemoglobin: 13.9 g/dL (ref 12.0–15.0)
MCH: 30.8 pg (ref 26.0–34.0)
MCHC: 33.9 g/dL (ref 30.0–36.0)
MCV: 90.7 fL (ref 80.0–100.0)
Platelets: 269 10*3/uL (ref 150–400)
RBC: 4.52 MIL/uL (ref 3.87–5.11)
RDW: 12.9 % (ref 11.5–15.5)
WBC: 8 10*3/uL (ref 4.0–10.5)
nRBC: 0 % (ref 0.0–0.2)

## 2022-10-02 LAB — BASIC METABOLIC PANEL
Anion gap: 9 (ref 5–15)
BUN: 20 mg/dL (ref 6–20)
CO2: 25 mmol/L (ref 22–32)
Calcium: 9.2 mg/dL (ref 8.9–10.3)
Chloride: 105 mmol/L (ref 98–111)
Creatinine, Ser: 0.77 mg/dL (ref 0.44–1.00)
GFR, Estimated: >60 mL/min (ref >60)
Glucose, Bld: 113 mg/dL — ABNORMAL HIGH (ref 70–99)
Potassium: 4.1 mmol/L (ref 3.5–5.1)
Sodium: 139 mmol/L (ref 135–145)

## 2022-10-06 MED ORDER — LACTATED RINGERS IV SOLN: INTRAVENOUS | Status: DC

## 2022-10-06 MED ORDER — CHLORHEXIDINE GLUCONATE 0.12 % MT SOLN
15.0000 mL | Freq: Once | OROMUCOSAL | Status: AC
  Administered 2022-10-07: 15 mL via OROMUCOSAL

## 2022-10-06 MED ORDER — CEFAZOLIN SODIUM-DEXTROSE 2-4 GM/100ML-% IV SOLN
2.0000 g | INTRAVENOUS | Status: AC
  Administered 2022-10-07: 2 g via INTRAVENOUS

## 2022-10-06 MED ORDER — ORAL CARE MOUTH RINSE: 15.0000 mL | Freq: Once | OROMUCOSAL | Status: AC

## 2022-10-06 MED ORDER — FAMOTIDINE 20 MG PO TABS
20.0000 mg | ORAL_TABLET | Freq: Once | ORAL | Status: AC
  Administered 2022-10-07: 20 mg via ORAL

## 2022-10-07 ENCOUNTER — Other Ambulatory Visit: Payer: Self-pay

## 2022-10-07 ENCOUNTER — Ambulatory Visit
Admission: RE | Admit: 2022-10-07 | Discharge: 2022-10-07 | Disposition: A | Discharge status: Home / Self Care | Payer: BC Managed Care – PPO | Attending: Surgery | Admitting: Surgery

## 2022-10-07 ENCOUNTER — Ambulatory Visit: Payer: BC Managed Care – PPO

## 2022-10-07 ENCOUNTER — Encounter
Admission: RE | Disposition: A | Discharge status: Home / Self Care | Payer: Self-pay | Source: Home / Self Care | Attending: Surgery

## 2022-10-07 ENCOUNTER — Ambulatory Visit: Payer: BC Managed Care – PPO | Admitting: Certified Registered"

## 2022-10-07 ENCOUNTER — Encounter: Payer: Self-pay | Admitting: Surgery

## 2022-10-07 DIAGNOSIS — E039 Hypothyroidism, unspecified: Secondary | ICD-10-CM | POA: Insufficient documentation

## 2022-10-07 DIAGNOSIS — M189 Osteoarthritis of first carpometacarpal joint, unspecified: Secondary | ICD-10-CM | POA: Insufficient documentation

## 2022-10-07 DIAGNOSIS — K219 Gastro-esophageal reflux disease without esophagitis: Secondary | ICD-10-CM | POA: Diagnosis not present

## 2022-10-07 HISTORY — PX: CARPOMETACARPAL (CMC) FUSION OF THUMB: SHX6290

## 2022-10-07 SURGERY — CARPOMETACARPAL (CMC) FUSION OF THUMB
Anesthesia: General | Site: Thumb | Laterality: Left

## 2022-10-07 MED ORDER — EPHEDRINE 5 MG/ML INJ
INTRAVENOUS | Status: AC
  Filled 2022-10-07: qty 5

## 2022-10-07 MED ORDER — ONDANSETRON HCL 4 MG/2ML IJ SOLN
INTRAMUSCULAR | Status: DC | PRN
  Administered 2022-10-07: 4 mg via INTRAVENOUS

## 2022-10-07 MED ORDER — FAMOTIDINE 20 MG PO TABS
ORAL_TABLET | ORAL | Status: AC
  Filled 2022-10-07: qty 1

## 2022-10-07 MED ORDER — TRAMADOL HCL 50 MG PO TABS: 50.0000 mg | ORAL_TABLET | Freq: Four times a day (QID) | ORAL | 0 refills | Status: AC | PRN

## 2022-10-07 MED ORDER — SODIUM CHLORIDE 0.9 % IV SOLN: INTRAVENOUS | Status: DC

## 2022-10-07 MED ORDER — OXYCODONE HCL 5 MG/5ML PO SOLN: 5.0000 mg | Freq: Once | ORAL | Status: DC | PRN

## 2022-10-07 MED ORDER — OXYCODONE HCL 5 MG PO TABS: 5.0000 mg | ORAL_TABLET | Freq: Once | ORAL | Status: DC | PRN

## 2022-10-07 MED ORDER — CEFAZOLIN SODIUM-DEXTROSE 2-4 GM/100ML-% IV SOLN
INTRAVENOUS | Status: AC
  Filled 2022-10-07: qty 100

## 2022-10-07 MED ORDER — KETOROLAC TROMETHAMINE 30 MG/ML IJ SOLN
INTRAMUSCULAR | Status: DC | PRN
  Administered 2022-10-07: 30 mg via INTRAVENOUS

## 2022-10-07 MED ORDER — ONDANSETRON HCL 4 MG/2ML IJ SOLN: 4.0000 mg | Freq: Four times a day (QID) | INTRAMUSCULAR | Status: DC | PRN

## 2022-10-07 MED ORDER — PHENYLEPHRINE 80 MCG/ML (10ML) SYRINGE FOR IV PUSH (FOR BLOOD PRESSURE SUPPORT)
PREFILLED_SYRINGE | INTRAVENOUS | Status: AC
  Filled 2022-10-07: qty 10

## 2022-10-07 MED ORDER — ONDANSETRON HCL 4 MG/2ML IJ SOLN
INTRAMUSCULAR | Status: AC
  Filled 2022-10-07: qty 2

## 2022-10-07 MED ORDER — EPHEDRINE SULFATE (PRESSORS) 50 MG/ML IJ SOLN
INTRAMUSCULAR | Status: DC | PRN
  Administered 2022-10-07: 10 mg via INTRAVENOUS

## 2022-10-07 MED ORDER — FENTANYL CITRATE (PF) 100 MCG/2ML IJ SOLN
INTRAMUSCULAR | Status: DC | PRN
  Administered 2022-10-07: 25 ug via INTRAVENOUS
  Administered 2022-10-07: 50 ug via INTRAVENOUS
  Administered 2022-10-07: 25 ug via INTRAVENOUS

## 2022-10-07 MED ORDER — METOCLOPRAMIDE HCL 10 MG PO TABS: 5.0000 mg | ORAL_TABLET | Freq: Three times a day (TID) | ORAL | Status: DC | PRN

## 2022-10-07 MED ORDER — BUPIVACAINE HCL (PF) 0.5 % IJ SOLN
INTRAMUSCULAR | Status: DC | PRN
  Administered 2022-10-07: 10 mL

## 2022-10-07 MED ORDER — ACETAMINOPHEN 10 MG/ML IV SOLN: 1000.0000 mg | Freq: Once | INTRAVENOUS | Status: DC | PRN

## 2022-10-07 MED ORDER — PROPOFOL 10 MG/ML IV BOLUS
INTRAVENOUS | Status: DC | PRN
  Administered 2022-10-07: 150 ug/kg/min via INTRAVENOUS
  Administered 2022-10-07: 180 ug/kg/min via INTRAVENOUS

## 2022-10-07 MED ORDER — CHLORHEXIDINE GLUCONATE 0.12 % MT SOLN
OROMUCOSAL | Status: AC
  Filled 2022-10-07: qty 15

## 2022-10-07 MED ORDER — DROPERIDOL 2.5 MG/ML IJ SOLN: 0.6250 mg | Freq: Once | INTRAMUSCULAR | Status: DC | PRN

## 2022-10-07 MED ORDER — PHENYLEPHRINE HCL (PRESSORS) 10 MG/ML IV SOLN
INTRAVENOUS | Status: DC | PRN
  Administered 2022-10-07 (×4): 80 ug via INTRAVENOUS

## 2022-10-07 MED ORDER — MIDAZOLAM HCL 2 MG/2ML IJ SOLN
INTRAMUSCULAR | Status: AC
  Filled 2022-10-07: qty 2

## 2022-10-07 MED ORDER — PHENYLEPHRINE HCL-NACL 20-0.9 MG/250ML-% IV SOLN
INTRAVENOUS | Status: AC
  Filled 2022-10-07: qty 250

## 2022-10-07 MED ORDER — DEXAMETHASONE SODIUM PHOSPHATE 10 MG/ML IJ SOLN
INTRAMUSCULAR | Status: DC | PRN
  Administered 2022-10-07: 10 mg via INTRAVENOUS

## 2022-10-07 MED ORDER — MIDAZOLAM HCL 2 MG/2ML IJ SOLN
INTRAMUSCULAR | Status: DC | PRN
  Administered 2022-10-07: 2 mg via INTRAVENOUS

## 2022-10-07 MED ORDER — LIDOCAINE HCL (PF) 2 % IJ SOLN
INTRAMUSCULAR | Status: AC
  Filled 2022-10-07: qty 5

## 2022-10-07 MED ORDER — PROPOFOL 10 MG/ML IV BOLUS
INTRAVENOUS | Status: AC
  Filled 2022-10-07: qty 20

## 2022-10-07 MED ORDER — LIDOCAINE HCL (CARDIAC) PF 100 MG/5ML IV SOSY
PREFILLED_SYRINGE | INTRAVENOUS | Status: DC | PRN
  Administered 2022-10-07: 100 mg via INTRAVENOUS

## 2022-10-07 MED ORDER — ACETAMINOPHEN 10 MG/ML IV SOLN
INTRAVENOUS | Status: DC | PRN
  Administered 2022-10-07: 1000 mg via INTRAVENOUS

## 2022-10-07 MED ORDER — DEXAMETHASONE SODIUM PHOSPHATE 10 MG/ML IJ SOLN
INTRAMUSCULAR | Status: AC
  Filled 2022-10-07: qty 1

## 2022-10-07 MED ORDER — ONDANSETRON HCL 4 MG PO TABS: 4.0000 mg | ORAL_TABLET | Freq: Four times a day (QID) | ORAL | Status: DC | PRN

## 2022-10-07 MED ORDER — PROMETHAZINE HCL 25 MG/ML IJ SOLN: 6.2500 mg | INTRAMUSCULAR | Status: DC | PRN

## 2022-10-07 MED ORDER — PROPOFOL 1000 MG/100ML IV EMUL
INTRAVENOUS | Status: AC
  Filled 2022-10-07: qty 100

## 2022-10-07 MED ORDER — DEXMEDETOMIDINE HCL IN NACL 80 MCG/20ML IV SOLN
INTRAVENOUS | Status: DC | PRN
  Administered 2022-10-07 (×2): 4 ug via INTRAVENOUS

## 2022-10-07 MED ORDER — GLYCOPYRROLATE 0.2 MG/ML IJ SOLN
INTRAMUSCULAR | Status: AC
  Filled 2022-10-07: qty 1

## 2022-10-07 MED ORDER — METOCLOPRAMIDE HCL 5 MG/ML IJ SOLN: 5.0000 mg | Freq: Three times a day (TID) | INTRAMUSCULAR | Status: DC | PRN

## 2022-10-07 MED ORDER — ACETAMINOPHEN 325 MG PO TABS: 325.0000 mg | ORAL_TABLET | Freq: Four times a day (QID) | ORAL | Status: DC | PRN

## 2022-10-07 MED ORDER — FENTANYL CITRATE (PF) 100 MCG/2ML IJ SOLN
25.0000 ug | INTRAMUSCULAR | Status: DC | PRN
  Administered 2022-10-07: 25 ug via INTRAVENOUS

## 2022-10-07 MED ORDER — FENTANYL CITRATE (PF) 100 MCG/2ML IJ SOLN
INTRAMUSCULAR | Status: AC
  Filled 2022-10-07: qty 2

## 2022-10-07 MED ORDER — BUPIVACAINE HCL (PF) 0.5 % IJ SOLN
INTRAMUSCULAR | Status: AC
  Filled 2022-10-07: qty 30

## 2022-10-07 MED ORDER — GLYCOPYRROLATE 0.2 MG/ML IJ SOLN
INTRAMUSCULAR | Status: DC | PRN
  Administered 2022-10-07: .2 mg via INTRAVENOUS

## 2022-10-07 MED ORDER — TRAMADOL HCL 50 MG PO TABS: 50.0000 mg | ORAL_TABLET | Freq: Four times a day (QID) | ORAL | 0 refills | Status: DC | PRN

## 2022-10-07 MED ORDER — ACETAMINOPHEN 10 MG/ML IV SOLN
INTRAVENOUS | Status: AC
  Filled 2022-10-07: qty 100

## 2022-10-07 MED ORDER — TRAMADOL HCL 50 MG PO TABS: 50.0000 mg | ORAL_TABLET | Freq: Four times a day (QID) | ORAL | Status: DC | PRN

## 2022-10-07 SURGICAL SUPPLY — 50 items
APL PRP STRL LF DISP 70% ISPRP (MISCELLANEOUS) ×1
APL SKNCLS STERI-STRIP NONHPOA (GAUZE/BANDAGES/DRESSINGS) ×2
BENZOIN TINCTURE PRP APPL 2/3 (GAUZE/BANDAGES/DRESSINGS) IMPLANT
BLADE DEBAKEY 8.0 (BLADE) ×1 IMPLANT
BLADE OSC/SAGITTAL 5.5X25 (BLADE) ×1 IMPLANT
BLADE SURG 15 STRL LF DISP TIS (BLADE) ×2 IMPLANT
BLADE SURG 15 STRL SS (BLADE) ×2
BNDG CMPR 5X3 KNIT ELC UNQ LF (GAUZE/BANDAGES/DRESSINGS) ×1
BNDG CMPR 5X4 CHSV STRCH STRL (GAUZE/BANDAGES/DRESSINGS) ×1
BNDG COHESIVE 4X5 TAN STRL LF (GAUZE/BANDAGES/DRESSINGS) ×1 IMPLANT
BNDG ELASTIC 3INX 5YD STR LF (GAUZE/BANDAGES/DRESSINGS) ×1 IMPLANT
BNDG ESMARCH 4 X 12 STRL LF (GAUZE/BANDAGES/DRESSINGS) ×1
BNDG ESMARCH 4X12 STRL LF (GAUZE/BANDAGES/DRESSINGS) ×1 IMPLANT
BNDG GZE 12X3 1 PLY HI ABS (GAUZE/BANDAGES/DRESSINGS) ×1
BNDG STRETCH GAUZE 3IN X12FT (GAUZE/BANDAGES/DRESSINGS) ×1 IMPLANT
BUR 4X55 1 (BURR) ×1 IMPLANT
CHLORAPREP W/TINT 26 (MISCELLANEOUS) ×1 IMPLANT
CUFF TOURN SGL QUICK 18X4 (TOURNIQUET CUFF) ×1 IMPLANT
DRAPE FLUOR MINI C-ARM 54X84 (DRAPES) ×1 IMPLANT
FORCEPS JEWEL BIP 4-3/4 STR (INSTRUMENTS) ×1 IMPLANT
GAUZE SPONGE 4X4 16PLY XRAY LF (GAUZE/BANDAGES/DRESSINGS) IMPLANT
GAUZE XEROFORM 1X8 LF (GAUZE/BANDAGES/DRESSINGS) ×1 IMPLANT
GLOVE BIO SURGEON STRL SZ8 (GLOVE) ×2 IMPLANT
GLOVE INDICATOR 8.0 STRL GRN (GLOVE) ×1 IMPLANT
GOWN STRL REUS W/ TWL XL LVL3 (GOWN DISPOSABLE) ×1 IMPLANT
GOWN STRL REUS W/TWL XL LVL3 (GOWN DISPOSABLE) ×1
KIT TURNOVER KIT A (KITS) ×1 IMPLANT
MANIFOLD NEPTUNE II (INSTRUMENTS) ×1 IMPLANT
NS IRRIG 500ML POUR BTL (IV SOLUTION) ×1 IMPLANT
PACK EXTREMITY ARMC (MISCELLANEOUS) ×1 IMPLANT
PAD CAST 3X4 CTTN HI CHSV (CAST SUPPLIES) ×2 IMPLANT
PADDING CAST COTTON 3X4 STRL (CAST SUPPLIES) ×2
PASSER SUT SWANSON 36MM LOOP (INSTRUMENTS) IMPLANT
SPLINT CAST 1 STEP 3X12 (MISCELLANEOUS) ×1 IMPLANT
STOCKINETTE 48X4 2 PLY STRL (GAUZE/BANDAGES/DRESSINGS) ×1 IMPLANT
STOCKINETTE IMPERVIOUS 9X36 MD (GAUZE/BANDAGES/DRESSINGS) ×1 IMPLANT
STOCKINETTE STRL 4IN 9604848 (GAUZE/BANDAGES/DRESSINGS) ×1 IMPLANT
STRIP CLOSURE SKIN 1/2X4 (GAUZE/BANDAGES/DRESSINGS) ×1 IMPLANT
SUT ETHIBOND 0 MO6 C/R (SUTURE) ×1 IMPLANT
SUT PROLENE 4 0 PS 2 18 (SUTURE) ×1 IMPLANT
SUT VIC AB 2-0 CT2 27 (SUTURE) ×1 IMPLANT
SUT VIC AB 2-0 SH 27 (SUTURE) ×1
SUT VIC AB 2-0 SH 27XBRD (SUTURE) ×1 IMPLANT
SUT VIC AB 3-0 SH 27 (SUTURE) ×1
SUT VIC AB 3-0 SH 27X BRD (SUTURE) ×1 IMPLANT
SYSTEM IMPLANT TIGHTROPE MINI (Anchor) IMPLANT
TAPE STRIPS DRAPE STRL (GAUZE/BANDAGES/DRESSINGS) IMPLANT
TRAP FLUID SMOKE EVACUATOR (MISCELLANEOUS) ×1 IMPLANT
WATER STERILE IRR 500ML POUR (IV SOLUTION) ×1 IMPLANT
WIRE Z .062 C-WIRE SPADE TIP (WIRE) IMPLANT

## 2022-10-07 NOTE — Anesthesia Procedure Notes (Signed)
Procedure Name: LMA Insertion Date/Time: 10/07/2022 8:45 AM  Performed by: Morene Crocker, CRNAPre-anesthesia Checklist: Patient identified, Patient being monitored, Timeout performed, Emergency Drugs available and Suction available Patient Re-evaluated:Patient Re-evaluated prior to induction Oxygen Delivery Method: Circle system utilized Preoxygenation: Pre-oxygenation with 100% oxygen Induction Type: IV induction Ventilation: Mask ventilation without difficulty LMA: LMA inserted LMA Size: 3.0 Tube type: Oral Number of attempts: 1 Placement Confirmation: positive ETCO2 and breath sounds checked- equal and bilateral Tube secured with: Tape Dental Injury: Teeth and Oropharynx as per pre-operative assessment  Comments: Smooth LMA placement, no complications noted.

## 2022-10-07 NOTE — Anesthesia Preprocedure Evaluation (Signed)
Anesthesia Evaluation  Patient identified by MRN, date of birth, ID band Patient awake    Reviewed: Allergy & Precautions, H&P , NPO status , Patient's Chart, lab work & pertinent test results, reviewed documented beta blocker date and time   History of Anesthesia Complications (+) PONV and history of anesthetic complications  Airway Mallampati: II  TM Distance: >3 FB Neck ROM: full    Dental  (+) Teeth Intact   Pulmonary neg pulmonary ROS   Pulmonary exam normal        Cardiovascular Exercise Tolerance: Good Normal cardiovascular exam+ dysrhythmias  Rate:Normal     Neuro/Psych  PSYCHIATRIC DISORDERS Anxiety Depression     Neuromuscular disease    GI/Hepatic Neg liver ROS,GERD  Medicated,,  Endo/Other  Hypothyroidism Hyperthyroidism   Renal/GU negative Renal ROS  negative genitourinary   Musculoskeletal   Abdominal   Peds  Hematology negative hematology ROS (+)   Anesthesia Other Findings   Reproductive/Obstetrics negative OB ROS                             Anesthesia Physical Anesthesia Plan  ASA: 2  Anesthesia Plan: General LMA   Post-op Pain Management:    Induction:   PONV Risk Score and Plan: 4 or greater  Airway Management Planned:   Additional Equipment:   Intra-op Plan:   Post-operative Plan:   Informed Consent: I have reviewed the patients History and Physical, chart, labs and discussed the procedure including the risks, benefits and alternatives for the proposed anesthesia with the patient or authorized representative who has indicated his/her understanding and acceptance.       Plan Discussed with: CRNA  Anesthesia Plan Comments:        Anesthesia Quick Evaluation

## 2022-10-07 NOTE — Op Note (Signed)
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  Post-Op Diagnosis:   Same.  Procedure:   Suspension arthroplasty left thumb CMC joint.   Surgeon:   Maryagnes Amos, MD  Assistant:   None  Anesthesia:   General LMA  Findings:   As above.  Complications:   None  EBL:   0 cc  Fluids:   600 cc crystalloid  TT:   65 minutes at 250 mmHg  Drains:   None  Closure:   3-0 Vicryl subcuticular sutures  Implants:   Arthrex 1.1 mm CMC Mini Tightrope.  Brief Clinical Note:   The patient is a 57 year old female with a long history of basilar left thumb pain. The patient's symptoms have progressed despite medications, activity modification, injections, splinting, etc. The patient's history and examination are consistent with advanced degenerative joint disease of the left thumb CMC joint which was confirmed by plain radiographs. The patient presents at this time for a suspension arthroplasty of the left thumb CMC joint.  Procedure:    The patient was brought into the operating room and lain in the supine position. After adequate general laryngeal mask anesthesia was obtained, the patient's left hand and upper extremity were prepped with ChloraPrep solution before being draped sterilely. Preoperative antibiotics were administered. A timeout was performed to verify the appropriate surgical site before the limb was exsanguinated with an Esmarch and the tourniquet inflated to 250 mmHg.   An approximately 3-4 cm longitudinal incision was made centered over the radial aspect of the thumb CMC joint. The incision was carried down through the subcutaneous cutaneous tissues with care taken to identify and protect the local neurovascular structures. Several small veins were cauterized with bipolar electrocautery. A longitudinal incision was made through the capsular tissues over the dorsal aspect of the thumb CMC joint.  Subperiosteal dissection was carried out to expose the trapezium dorsally and volarly. Once the [facility] and scaphotrapezial joints were identified, a sagittal cut was made in the trapezium using the micro-oscillating saw. This cut extended approximately two-thirds down through the bone. A Hoke osteotome was used to complete transection of the bone. The bone was then removed piecemeal using rongeurs and further dissection. The flexor carpi radialis tendon was identified at the base of the defect and was noted to be intact. The base of the thumb metacarpal was prepared by exposing its radial base.  The Arthrex 1.1 mm guidewire was drilled up through the proximal end of the thumb metacarpal beginning at the dorsoradial surface and advancing into the volar radial aspect of the proximal index metacarpal shaft. The adequacy of pin position was verified using FluoroScan imaging in AP, lateral, and oblique projections and found to be excellent.  An approximately 1 cm incision was made over the dorsal aspect of the proximal end of the index metacarpal. The incision was carried down through the subcutaneous tissues with care taken to avoid damaging the extensor tendon. The interosseous muscle was dissected away from the dorso-ulnar aspect of the proximal index metacarpal shaft before the guidewire was advanced through the final cortex into this wound. The suture was passed through the nitinol wire loop and pulled across the thumb and index metacarpals, seating the mini-Endobutton against the proximal end of the thumb metacarpal. The suture was cut and each and passed through the second mini-Endobutton. The Endobutton was advanced to rest against the dorso-ulnar aspect of the proximal index  metacarpal shaft before it was tied securely with appropriate tension to stabilize the base of the thumb metacarpal against the base of the index metacarpal. Again, the adequacy of Endobutton position and metacarpal reduction was verified  fluoroscopically in AP, lateral, and oblique projections and found to be excellent. The thumb was then placed through a range of motion. It was able to be abducted and adducted fully and remained distracted to gentle axial loading.  The wounds were copiously irrigated with sterile saline solution using bulb irrigation. The thumb CMC capsular tissues were reapproximated using 2-0 Vicryl interrupted sutures before the skin was closed using 3-0 Vicryl subcuticular interrupted sutures. Benzoin and Steri-Strips were applied to the skin. The skin of the more dorsal incision was closed using a single 3-0 Vicryl subcuticular suture before benzoin and a Steri-Strip were applied over this wound as well. A total of 10 cc of 0.5% plain Sensorcaine was injected in and around both incisions to help with postoperative analgesia. A sterile bulky dressing was applied to the wounds before the patient was placed into a fiberglass thumb spica splint maintaining the thumb in the position of function. The patient was then awakened, extubated, and returned to the recovery room in satisfactory condition after tolerating the procedure well.

## 2022-10-07 NOTE — Transfer of Care (Signed)
Immediate Anesthesia Transfer of Care Note  Patient: Olivia Armstrong  Procedure(s) Performed: SUSPENSION ARTHROPLASTY OF LEFT THUMB CMC JOINT (Left: Thumb)  Patient Location: PACU  Anesthesia Type:General  Level of Consciousness: drowsy  Airway & Oxygen Therapy: Patient Spontanous Breathing and Patient connected to face mask oxygen  Post-op Assessment: Report given to RN and Post -op Vital signs reviewed and stable  Post vital signs: Reviewed and stable  Last Vitals:  Vitals Value Taken Time  BP 96/51 10/07/22 1022  Temp 35.9 1022  Pulse 78 10/07/22 1029  Resp 15 10/07/22 1029  SpO2 100 % 10/07/22 1029  Vitals shown include unvalidated device data.  Last Pain:  Vitals:   10/07/22 0800  TempSrc: Temporal  PainSc: 0-No pain      Patients Stated Pain Goal: 0 (10/07/22 0800)  Complications: No notable events documented.

## 2022-10-07 NOTE — Discharge Instructions (Addendum)
Orthopedic discharge instructions: Keep splint dry and intact. Keep hand elevated above heart level. Apply ice to affected area frequently. Take ibuprofen 600-800 mg TID with meals for 3-5 days, then as necessary. Take pain medication as prescribed or ES Tylenol when needed.  Return for follow-up in 10-14 days or as scheduled.  AMBULATORY SURGERY  DISCHARGE INSTRUCTIONS   The drugs that you were given will stay in your system until tomorrow so for the next 24 hours you should not:  Drive an automobile Make any legal decisions Drink any alcoholic beverage   You may resume regular meals tomorrow.  Today it is better to start with liquids and gradually work up to solid foods.  You may eat anything you prefer, but it is better to start with liquids, then soup and crackers, and gradually work up to solid foods.   Please notify your doctor immediately if you have any unusual bleeding, trouble breathing, redness and pain at the surgery site, drainage, fever, or pain not relieved by medication.    Please contact your physician with any problems or Same Day Surgery at [phone], Monday through Friday 6 am to 4 pm, or [place] at [facility] number at [phone].

## 2022-10-07 NOTE — H&P (Signed)
History of Present Illness:  Olivia Armstrong is a 57 y.o. female who presents for history and physical for an upcoming left thumb CMC suspension arthroplasty to be done by Dr. Joice Lofts on October 07, 2022. The patient has seen Dr. Joice Lofts in the past for follow-up of her basilar left thumb pain secondary to degenerative joint disease of the left thumb CMC joint. The patient was last seen for these symptoms 6 months ago. At this visit, she received a steroid injection into the left thumb which she states provided substantial relief of her symptoms, lasting over 3 months before her symptoms began to recur. She again notes moderate intermittent pain in her left thumb which she describes as a stabbing type of pain with certain motions or activities. She has been taking ibuprofen and applying ice as necessary with temporary partial relief of her symptoms. She denies any reinjury to the thumb, and denies any numbness or paresthesias to her fingertips. The patient is a prediabetic.  Current Outpatient Medications:  ARMOUR THYROID 30 mg tablet Take 1 tablet (30 mg total) by mouth every morning before breakfast (0630) 90 tablet 3  brimonidine (LUMIFY) 0.025 % Drop Apply to eye  desvenlafaxine succinate (PRISTIQ) 50 MG ER tablet Take 50 mg by mouth once daily  ergocalciferol, vitamin D2, 1,250 mcg (50,000 unit) capsule Take 1 capsule (50,000 Units total) by mouth twice a week 12 capsule 1  estradiol (DOTTI) patch 0.05 mg/24 hr PLACE 1 PATCH ON THE SKIN TWICE A WEEK 24 patch 3  ibuprofen (MOTRIN) 200 MG tablet Take by mouth  progesterone (PROMETRIUM) 200 MG capsule TAKE 1 CAPSULE DAILY 90 capsule 3  tretinoin (RETIN-A) 0.05 % cream Apply 1 Application topically nightly  UNABLE TO FIND Med Name: [employer] Multivitamin  zinc sulfate (ZINC-15 ORAL) Take 1 Application by mouth once daily  zolpidem (AMBIEN) 5 MG tablet Take 1 tablet (5 mg total) by mouth at bedtime 90 tablet 1   Allergies:  Kiwi (Actinidia Chinensis)  Anaphylaxis  Mouth and throat swelling and itching  Remeron [Mirtazapine] Dizziness  And creepy sensation   Past Medical History:  Anxiety and depression 09/14/2013  CAD (coronary artery disease)  Cervical spondylosis with radiculopathy  Depression  HPV (human papilloma virus) anogenital infection  Hyperlipidemia  Hypothyroidism  Palpitations   Past Surgical History:  Release trigger thumb 2015  COMBINED HYSTEROSCOPY DIAGNOSTIC / D&C 02/08/2017 [facility])  DILATION AND CURETTAGE OF UTERUS 02/08/2017 (With [facility])  Endoscopic left carpal tunnel release. Left 01/05/2018 (Dr. Joice Lofts)  CHOLECYSTECTOMY 10/07/2018 ([facility])  COLONOSCOPY  ENDOSCOPY BILE DUCT   Family History  Problem Relation Name Age of Onset  Thyroid disease Mother Lajada Munir  Colon polyps Mother Elektra Mauthe  Myocardial Infarction (Heart attack) Father Glora Jeanphilippe  Heart disease Father Shaparis Herbold  High blood pressure (Hypertension) Father Shanny Durig (deceased [id number] heart attack)  Colon cancer Sister  Myocardial Infarction (Heart attack) Paternal Grandfather  Colon cancer Sister Misty Stanley (Nov 2008)  Colon polyps Sister Christye Vierling   Social History:   Socioeconomic History:  Marital status: Divorced  Number of children: 0  Years of education: 16  Occupational History  Occupation: Environmental health practitioner  Tobacco Use  Smoking status: Former  Current packs/day: 0.00  Types: Cigarettes  Start date: 04/21/1979  Quit date: 04/20/1984  Years since quitting: 38.4  Smokeless tobacco: Never  Vaping Use  Vaping status: Never Used  Substance and Sexual Activity  Alcohol use: Yes  Alcohol/week: 1.0  standard drink of alcohol  Types: 1 Standard drinks or equivalent per week  Comment: ocassionally  Drug use: No  Sexual activity: Not Currently  Partners: Male  Birth control/protection: Post-menopausal   Social Determinants of Health:   Physicist, medical  Strain: Low Risk (08/12/2022)  Overall Financial Resource Strain (CARDIA)  Difficulty of Paying Living Expenses: Not hard at all  Food Insecurity: No Food Insecurity (08/12/2022)  Hunger Vital Sign  Worried About Running Out of Food in the Last Year: Never true  Ran Out of Food in the Last Year: Never true  Transportation Needs: No Transportation Needs (08/12/2022)  PRAPARE - Risk analyst (Medical): No  Lack of Transportation (Non-Medical): No   Review of Systems:  A comprehensive 14 point ROS was performed, reviewed, and the pertinent orthopaedic findings are documented in the HPI.  Physical Exam: Vitals:  10/05/22 1453  Weight: 72.1 kg (159 lb)  Height: 160 cm (5\' 3" )   General/Constitutional: The patient appears to be well-nourished, well-developed, and in no acute distress. Neuro/Psych: Normal mood and affect, oriented to person, place and time. Eyes: Non-icteric. Pupils are equal, round, and reactive to light, and exhibit synchronous movement. ENT: Unremarkable. Lymphatic: No palpable adenopathy. Respiratory: Lungs clear to auscultation, Normal chest excursion, No wheezes, and Non-labored breathing Cardiovascular: Regular rate and rhythm. No murmurs. and No edema, swelling or tenderness, except as noted in detailed exam. Integumentary: No impressive skin lesions present, except as noted in detailed exam. Musculoskeletal: Unremarkable, except as noted in detailed exam.  Heart: Examination of the heart reveals regular, rate, and rhythm. There is no murmur noted on ascultation. There is a normal apical pulse.  Lungs: Lungs are clear to auscultation. There is no wheeze, rhonchi, or crackles. There is normal expansion of bilateral chest walls.   Left hand exam:  Skin inspection of the left hand again is unremarkable. No swelling, erythema, ecchymosis, abrasions, or other skin abnormalities are identified. She experiences mild focal tenderness to palpation  over the left thumb CMC joint. There are no other areas of tenderness around the wrist or hand, especially along the first dorsal compartment. She can actively flex and extend all digits fully without any pain or triggering. She exhibits a minimally positive grind test to the left thumb. She exhibits an equivocally positive Finkelstein's test. She again is neurovascularly intact to all digits of the left hand.  X-rays/MRI/Lab data:  AP, lateral, and oblique views of the left hand are obtained. These films demonstrate moderate degenerative changes of the left thumb CMC joint as manifest by radial subluxation of the metacarpal, decreased joint space, and calcifications in the radial capsule. No lytic lesions or fractures are identified.  Assessment: 1. Primary osteoarthritis of first carpometacarpal joint of left hand.   Plan: The treatment options have been discussed in detail with the patient. Regarding her recurrent left basilar thumb symptoms, the patient is becoming increasingly frustrated by her recurrent symptoms and function limitations, and is now ready to consider more aggressive treatment options. Therefore, I have recommended a surgical procedure, specifically a suspension arthroplasty of the left thumb CMC joint. The procedure was discussed with the patient, as were the potential risks (including bleeding, infection, nerve and/or blood vessel injury, persistent or recurrent pain, stiffness of the thumb, weakness of grip, need for further surgery, blood clots, strokes, heart attacks and/or arhythmias, pneumonia, etc.) and benefits. The patient states her understanding and wishes to proceed. All of the patient's questions and concerns were answered. She can  call any time with further concerns. She will follow up post-surgery, routine.   This note will serve as the history and physical for surgery scheduled on October 07, 2022 for a left thumb CMC suspension arthroplasty to be done by Dr. Joice Lofts.     H&P reviewed and patient re-examined. No changes.

## 2022-10-09 NOTE — Anesthesia Postprocedure Evaluation (Signed)
Anesthesia Post Note  Patient: Olivia Armstrong  Procedure(s) Performed: SUSPENSION ARTHROPLASTY OF LEFT THUMB CMC JOINT (Left: Thumb)  Patient location during evaluation: PACU Anesthesia Type: General Level of consciousness: awake and alert Pain management: pain level controlled Vital Signs Assessment: post-procedure vital signs reviewed and stable Respiratory status: spontaneous breathing, nonlabored ventilation, respiratory function stable and patient connected to nasal cannula oxygen Cardiovascular status: blood pressure returned to baseline and stable Postop Assessment: no apparent nausea or vomiting Anesthetic complications: no   No notable events documented.   Last Vitals:  Vitals:   10/07/22 1130 10/07/22 1145  BP: 106/61 108/64  Pulse: 76 79  Resp: 11   Temp:  36.7 C  SpO2: 96% 98%    Last Pain:  Vitals:   10/07/22 1145  TempSrc: Temporal  PainSc: 3                  Yevette Edwards

## 2022-10-26 ENCOUNTER — Encounter: Payer: Self-pay | Admitting: Gastroenterology

## 2022-10-29 NOTE — Anesthesia Preprocedure Evaluation (Addendum)
Anesthesia Evaluation  Patient identified by MRN, date of birth, ID band Patient awake    Reviewed: Allergy & Precautions, H&P , NPO status , Patient's Chart, lab work & pertinent test results  History of Anesthesia Complications (+) PONV and history of anesthetic complications  Airway Mallampati: I  TM Distance: >3 FB Neck ROM: Full    Dental no notable dental hx.    Pulmonary neg pulmonary ROS   Pulmonary exam normal breath sounds clear to auscultation       Cardiovascular negative cardio ROS Normal cardiovascular exam+ dysrhythmias  Rhythm:Regular Rate:Normal     Neuro/Psych  PSYCHIATRIC DISORDERS Anxiety Depression     Neuromuscular disease negative neurological ROS  negative psych ROS   GI/Hepatic negative GI ROS, Neg liver ROS,GERD  ,,  Endo/Other  negative endocrine ROSHypothyroidism Hyperthyroidism   Renal/GU negative Renal ROS  negative genitourinary   Musculoskeletal negative musculoskeletal ROS (+) Arthritis ,    Abdominal   Peds negative pediatric ROS (+)  Hematology negative hematology ROS (+)   Anesthesia Other Findings Hyperthyroidism  Vitamin D deficiency Anxiety and depression  Depression Hyperlipidemia, unspecified  Menopausal symptoms Palpitations  S/P trigger finger release Dysrhythmia  GERD (gastroesophageal reflux disease) PONV (postoperative nausea and vomiting)  Pre-diabetes Benign recurrent vertigo     Reproductive/Obstetrics negative OB ROS                             Anesthesia Physical Anesthesia Plan  ASA: 2  Anesthesia Plan: General   Post-op Pain Management:    Induction: Intravenous  PONV Risk Score and Plan:   Airway Management Planned: Natural Airway and Nasal Cannula  Additional Equipment:   Intra-op Plan:   Post-operative Plan:   Informed Consent: I have reviewed the patients History and Physical, chart, labs and discussed  the procedure including the risks, benefits and alternatives for the proposed anesthesia with the patient or authorized representative who has indicated his/her understanding and acceptance.     Dental Advisory Given  Plan Discussed with: Anesthesiologist, CRNA and Surgeon  Anesthesia Plan Comments: (Patient consented for risks of anesthesia including but not limited to:  - adverse reactions to medications - risk of airway placement if required - damage to eyes, teeth, lips or other oral mucosa - nerve damage due to positioning  - sore throat or hoarseness - Damage to heart, brain, nerves, lungs, other parts of body or loss of life  Patient voiced understanding.)        Anesthesia Quick Evaluation

## 2022-10-30 ENCOUNTER — Encounter: Payer: Self-pay | Admitting: Gastroenterology

## 2022-10-30 ENCOUNTER — Ambulatory Visit: Payer: BC Managed Care – PPO | Admitting: Anesthesiology

## 2022-10-30 ENCOUNTER — Other Ambulatory Visit: Payer: Self-pay

## 2022-10-30 ENCOUNTER — Ambulatory Visit
Admission: RE | Admit: 2022-10-30 | Discharge: 2022-10-30 | Disposition: A | Discharge status: Home / Self Care | Payer: BC Managed Care – PPO | Attending: Gastroenterology | Admitting: Gastroenterology

## 2022-10-30 ENCOUNTER — Encounter
Admission: RE | Disposition: A | Discharge status: Home / Self Care | Payer: Self-pay | Source: Home / Self Care | Attending: Gastroenterology

## 2022-10-30 DIAGNOSIS — K219 Gastro-esophageal reflux disease without esophagitis: Secondary | ICD-10-CM | POA: Insufficient documentation

## 2022-10-30 DIAGNOSIS — Z09 Encounter for follow-up examination after completed treatment for conditions other than malignant neoplasm: Secondary | ICD-10-CM | POA: Diagnosis not present

## 2022-10-30 DIAGNOSIS — E0590 Thyrotoxicosis, unspecified without thyrotoxic crisis or storm: Secondary | ICD-10-CM | POA: Insufficient documentation

## 2022-10-30 DIAGNOSIS — F419 Anxiety disorder, unspecified: Secondary | ICD-10-CM | POA: Insufficient documentation

## 2022-10-30 DIAGNOSIS — F32A Depression, unspecified: Secondary | ICD-10-CM | POA: Diagnosis not present

## 2022-10-30 DIAGNOSIS — K5730 Diverticulosis of large intestine without perforation or abscess without bleeding: Secondary | ICD-10-CM | POA: Diagnosis not present

## 2022-10-30 DIAGNOSIS — Z860100 Personal history of colon polyps, unspecified: Secondary | ICD-10-CM

## 2022-10-30 DIAGNOSIS — Z1211 Encounter for screening for malignant neoplasm of colon: Secondary | ICD-10-CM | POA: Insufficient documentation

## 2022-10-30 DIAGNOSIS — E785 Hyperlipidemia, unspecified: Secondary | ICD-10-CM | POA: Insufficient documentation

## 2022-10-30 DIAGNOSIS — K635 Polyp of colon: Secondary | ICD-10-CM | POA: Diagnosis not present

## 2022-10-30 DIAGNOSIS — Z800 Family history of malignant neoplasm of digestive organs: Secondary | ICD-10-CM | POA: Diagnosis not present

## 2022-10-30 DIAGNOSIS — E559 Vitamin D deficiency, unspecified: Secondary | ICD-10-CM | POA: Diagnosis not present

## 2022-10-30 DIAGNOSIS — K640 First degree hemorrhoids: Secondary | ICD-10-CM | POA: Insufficient documentation

## 2022-10-30 HISTORY — DX: Benign paroxysmal vertigo, unspecified ear: H81.10

## 2022-10-30 HISTORY — PX: POLYPECTOMY: SHX5525

## 2022-10-30 HISTORY — PX: COLONOSCOPY WITH PROPOFOL: SHX5780

## 2022-10-30 SURGERY — COLONOSCOPY WITH PROPOFOL
Anesthesia: General | Site: Rectum

## 2022-10-30 MED ORDER — LIDOCAINE HCL URETHRAL/MUCOSAL 2 % EX GEL
CUTANEOUS | Status: AC
  Filled 2022-10-30: qty 10

## 2022-10-30 MED ORDER — PHENYLEPHRINE 80 MCG/ML (10ML) SYRINGE FOR IV PUSH (FOR BLOOD PRESSURE SUPPORT)
PREFILLED_SYRINGE | INTRAVENOUS | Status: AC
  Filled 2022-10-30: qty 20

## 2022-10-30 MED ORDER — PROPOFOL 10 MG/ML IV BOLUS
INTRAVENOUS | Status: DC | PRN
  Administered 2022-10-30 (×2): 40 mg via INTRAVENOUS
  Administered 2022-10-30: 130 mg via INTRAVENOUS

## 2022-10-30 MED ORDER — DEXMEDETOMIDINE HCL IN NACL 80 MCG/20ML IV SOLN
INTRAVENOUS | Status: AC
  Filled 2022-10-30: qty 20

## 2022-10-30 MED ORDER — STERILE WATER FOR IRRIGATION IR SOLN
Status: DC | PRN
  Administered 2022-10-30: 50 mL

## 2022-10-30 MED ORDER — PROPOFOL 1000 MG/100ML IV EMUL
INTRAVENOUS | Status: AC
  Filled 2022-10-30: qty 100

## 2022-10-30 MED ORDER — LACTATED RINGERS IV SOLN: INTRAVENOUS | Status: DC

## 2022-10-30 MED ORDER — ONDANSETRON HCL 4 MG/2ML IJ SOLN
INTRAMUSCULAR | Status: DC | PRN
  Administered 2022-10-30: 4 mg via INTRAVENOUS

## 2022-10-30 MED ORDER — EPHEDRINE 5 MG/ML INJ
INTRAVENOUS | Status: AC
  Filled 2022-10-30: qty 5

## 2022-10-30 MED ORDER — LIDOCAINE HCL (CARDIAC) PF 100 MG/5ML IV SOSY
PREFILLED_SYRINGE | INTRAVENOUS | Status: DC | PRN
  Administered 2022-10-30: 50 mg via INTRAVENOUS

## 2022-10-30 MED ORDER — DEXAMETHASONE SODIUM PHOSPHATE 4 MG/ML IJ SOLN
INTRAMUSCULAR | Status: DC | PRN
  Administered 2022-10-30: 4 mg via INTRAVENOUS

## 2022-10-30 SURGICAL SUPPLY — 7 items
FORCEPS BIOP RAD 4 LRG CAP 4 (CUTTING FORCEPS) IMPLANT
GOWN CVR UNV OPN BCK APRN NK (MISCELLANEOUS) ×4 IMPLANT
GOWN ISOL THUMB LOOP REG UNIV (MISCELLANEOUS) ×4
KIT PRC NS LF DISP ENDO (KITS) ×2 IMPLANT
KIT PROCEDURE OLYMPUS (KITS) ×2
MANIFOLD NEPTUNE II (INSTRUMENTS) ×2 IMPLANT
WATER STERILE IRR 250ML POUR (IV SOLUTION) ×2 IMPLANT

## 2022-10-30 NOTE — H&P (Signed)
Midge Minium, MD [facility] [address], [address] [place], [place] [address] Phone:[phone] Fax : [phone]  Primary Care Physician:  Marguarite Arbour, MD Primary Gastroenterologist:  Dr. Servando Snare  Pre-Procedure History & Physical: HPI:  Olivia Armstrong is a 57 y.o. female is here for an colonoscopy.   Past Medical History:  Diagnosis Date   Anxiety and depression 09/14/2013   Benign recurrent vertigo    pre - 2012.  none recently   Depression 07/30/2016   Dysrhythmia    palpitations.  no treatment. saw dr. Elijah Birk and had stress test but no diagnosis   GERD (gastroesophageal reflux disease)    winding down on antacids   Hyperlipidemia, unspecified 09/14/2013   Hyperthyroidism    Menopausal symptoms 06/10/2016   Overview:  prometrium 200mg  daily estrdiol patch 0.05mg /day q3days   Palpitations 09/14/2013   PONV (postoperative nausea and vomiting)    Pre-diabetes    S/P trigger finger release 10/27/2013   Vitamin D deficiency     Past Surgical History:  Procedure Laterality Date   CARPAL TUNNEL RELEASE Left 01/05/2018   Procedure: CARPAL TUNNEL RELEASE ENDOSCOPIC;  Surgeon: Christena Flake, MD;  Location: [facility];  Service: Orthopedics;  Laterality: Left;   CARPOMETACARPAL (CMC) FUSION OF THUMB Left 10/07/2022   Procedure: SUSPENSION ARTHROPLASTY OF LEFT THUMB CMC JOINT;  Surgeon: Christena Flake, MD;  Location: [facility];  Service: Orthopedics;  Laterality: Left;   CHOLECYSTECTOMY N/A 10/07/2018   Procedure: LAPAROSCOPIC CHOLECYSTECTOMY;  Surgeon: Carolan Shiver, MD;  Location: [facility];  Service: General;  Laterality: N/A;   COLONOSCOPY  09/2012   COLONOSCOPY WITH PROPOFOL N/A 10/15/2017   Procedure: COLONOSCOPY WITH PROPOFOL;  Surgeon: Midge Minium, MD;  Location: [facility];  Service: Endoscopy;  Laterality: N/A;   ESOPHAGOGASTRODUODENOSCOPY (EGD) WITH PROPOFOL N/A 10/15/2017   Procedure: ESOPHAGOGASTRODUODENOSCOPY (EGD) WITH PROPOFOL;  Surgeon:  Midge Minium, MD;  Location: [facility];  Service: Endoscopy;  Laterality: N/A;   HYSTEROSCOPY WITH D & C N/A 02/05/2017   Procedure: DILATATION AND CURETTAGE /HYSTEROSCOPY;  Surgeon: Ward, Elenora Fender, MD;  Location: [facility];  Service: Gynecology;  Laterality: N/A;   POLYPECTOMY N/A 10/15/2017   Procedure: POLYPECTOMY;  Surgeon: Midge Minium, MD;  Location: [facility];  Service: Endoscopy;  Laterality: N/A;   TRIGGER FINGER RELEASE Left 2015   thumb, under local    Prior to Admission medications   Medication Sig Start Date End Date Taking? Authorizing Provider  Brimonidine Tartrate (LUMIFY) 0.025 % SOLN Place 1 drop into both eyes daily as needed (Itchy eyes).   Yes [provider]  desvenlafaxine (PRISTIQ) 50 MG 24 hr tablet Take 1 tablet (50 mg total) by mouth daily. 05/07/22  Yes Mozingo, Thereasa Solo, NP  estradiol (VIVELLE-DOT) 0.05 MG/24HR patch Place 1 patch onto the skin 2 (two) times a week. Change patch on Wed and Sun. 03/20/16  Yes [provider]  hydrocortisone 2.5 % ointment Apply 1 application topically 2 (two) times daily as needed (itching).    Yes [provider]  ibuprofen (ADVIL,MOTRIN) 200 MG tablet Take 600 mg by mouth every 6 (six) hours as needed for fever, headache or mild pain.    Yes [provider]  progesterone (PROMETRIUM) 200 MG capsule Take 200 mg by mouth at bedtime.  03/20/16  Yes [provider]  thyroid (ARMOUR THYROID) 30 MG tablet Take 30 mg by mouth daily before breakfast.  10/04/15  Yes [provider]  tretinoin (RETIN-A) 0.05 %  cream Apply 1 application topically at bedtime as needed (for skin care).   Yes [provider]  Vitamin D, Ergocalciferol, (DRISDOL) 50000 units CAPS capsule Take 50,000 Units by mouth See admin instructions. Take 1 capsule twice a week on Tuesdays and Saturdays. 05/18/16  Yes [provider]  zolpidem (AMBIEN) 5 MG tablet Take 5 mg by mouth at  bedtime. 05/21/16  Yes [provider]  traMADol (ULTRAM) 50 MG tablet Take 1 tablet (50 mg total) by mouth every 6 (six) hours as needed. Patient not taking: Reported on 10/26/2022 10/07/22 10/07/23  Poggi, Excell Seltzer, MD    Allergies as of 08/13/2022 - Review Complete 08/13/2022  Allergen Reaction Noted   Kiwi extract Anaphylaxis 01/26/2017   Mirtazapine Other (See Comments) 09/29/2018    Family History  Problem Relation Age of Onset   Breast cancer Maternal Grandmother 90       mat great gm   Colon cancer Sister     Social History   Socioeconomic History   Marital status: Divorced    Spouse name: Not on file   Number of children: Not on file   Years of education: Not on file   Highest education level: Not on file  Occupational History   Not on file  Tobacco Use   Smoking status: Never   Smokeless tobacco: Never  Vaping Use   Vaping status: Never Used  Substance and Sexual Activity   Alcohol use: Yes    Comment: occasionally   Drug use: No   Sexual activity: Never  Other Topics Concern   Not on file  Social History Narrative   Not on file   Social Determinants of Health   Financial Resource Strain: Low Risk  (08/12/2022)   Received from [facility], [facility]   Overall Financial Resource Strain (CARDIA)    Difficulty of Paying Living Expenses: Not hard at all  Food Insecurity: No Food Insecurity (08/12/2022)   Received from [facility], [facility]   Hunger Vital Sign    Worried About Running Out of Food in the Last Year: Never true    Ran Out of Food in the Last Year: Never true  Transportation Needs: No Transportation Needs (08/12/2022)   Received from [facility], [facility]   [facility] - Transportation    In the past 12 months, has lack of transportation kept you from medical appointments or from getting medications?: No    Lack of Transportation  (Non-Medical): No  Physical Activity: Not on file  Stress: Not on file  Social Connections: Not on file  Intimate Partner Violence: Not on file    Review of Systems: See HPI, otherwise negative ROS  Physical Exam: BP (!) 147/79   Pulse 85   Temp (!) 97 F (36.1 C) (Oral)   Resp 15   Ht 5\' 3"  (1.6 m)   Wt 68.5 kg   SpO2 96%   BMI 26.75 kg/m  General:   Alert,  pleasant and cooperative in NAD Head:  Normocephalic and atraumatic. Neck:  Supple; no masses or thyromegaly. Lungs:  Clear throughout to auscultation.    Heart:  Regular rate and rhythm. Abdomen:  Soft, nontender and nondistended. Normal bowel sounds, without guarding, and without rebound.   Neurologic:  Alert and  oriented x4;  grossly normal neurologically.  Impression/Plan: Olivia Armstrong is here for an colonoscopy to be performed for a history of adenomatous polyps  on 2019   Risks, benefits, limitations, and alternatives regarding  colonoscopy have been reviewed with the patient.  Questions have been answered.  All parties agreeable.   Midge Minium, MD  10/30/2022, 7:24 AM

## 2022-10-30 NOTE — Op Note (Signed)
[facility]astroenterology Patient Name: Olivia Armstrong Procedure Date: 10/30/2022 7:54 AM MRN: [id number] Account #: [id number] Date of Birth: 04/23/1965 Admit Type: Outpatient Age: 57 Room: [facility] OR ROOM 01 Gender: Female Note Status: Finalized Instrument Name: [id number] Procedure:             Colonoscopy Indications:           High risk colon cancer surveillance: Personal history                         of colonic polyps Providers:             Midge Minium MD, MD Referring MD:          Duane Lope. Judithann Sheen, MD (Referring MD) Medicines:             Propofol per Anesthesia Complications:         No immediate complications. Procedure:             Pre-Anesthesia Assessment:                        - Prior to the procedure, a History and Physical was                         performed, and patient medications and allergies were                         reviewed. The patient's tolerance of previous                         anesthesia was also reviewed. The risks and benefits                         of the procedure and the sedation options and risks                         were discussed with the patient. All questions were                         answered, and informed consent was obtained. Prior                         Anticoagulants: The patient has taken no anticoagulant                         or antiplatelet agents. ASA Grade Assessment: II - A                         patient with mild systemic disease. After reviewing                         the risks and benefits, the patient was deemed in                         satisfactory condition to undergo the procedure.                        After obtaining informed consent, the colonoscope was  passed under direct vision. Throughout the procedure,                         the patient's blood pressure, pulse, and oxygen                         saturations were monitored continuously. The                          Colonoscope was introduced through the anus and                         advanced to the the cecum, identified by appendiceal                         orifice and ileocecal valve. The colonoscopy was                         performed without difficulty. The patient tolerated                         the procedure well. The quality of the bowel                         preparation was excellent. Findings:      The perianal and digital rectal examinations were normal.      A 2 mm polyp was found in the descending colon. The polyp was sessile.       The polyp was removed with a cold biopsy forceps. Resection and       retrieval were complete.      Multiple small-mouthed diverticula were found in the sigmoid colon.      Non-bleeding internal hemorrhoids were found during retroflexion. The       hemorrhoids were Grade I (internal hemorrhoids that do not prolapse). Impression:            - One 2 mm polyp in the descending colon, removed with                         a cold biopsy forceps. Resected and retrieved.                        - Diverticulosis in the sigmoid colon.                        - Non-bleeding internal hemorrhoids. Recommendation:        - Discharge patient to home.                        - Resume previous diet.                        - Continue present medications.                        - Await pathology results.                        - If the pathology report reveals adenomatous tissue,  then repeat the colonoscopy for surveillance in 7                         years. Procedure Code(s):     --- Professional ---                        [phone], Colonoscopy, flexible; with biopsy, single or                         multiple Diagnosis Code(s):     --- Professional ---                        Z86.010, Personal history of colonic polyps                        D12.4, Benign neoplasm of descending colon CPT copyright 2022 American Medical Association. All  rights reserved. The codes documented in this report are preliminary and upon coder review may  be revised to meet current compliance requirements. Midge Minium MD, MD 10/30/2022 8:13:57 AM This report has been signed electronically. Number of Addenda: 0 Note Initiated On: 10/30/2022 7:54 AM Scope Withdrawal Time: 0 hours 8 minutes 0 seconds  Total Procedure Duration: 0 hours 11 minutes 50 seconds  Estimated Blood Loss:  Estimated blood loss: none.      [facility]

## 2022-10-30 NOTE — Transfer of Care (Signed)
Immediate Anesthesia Transfer of Care Note  Patient: Olivia Armstrong  Procedure(s) Performed: COLONOSCOPY WITH PROPOFOL (Rectum) POLYPECTOMY (Rectum)  Patient Location: PACU  Anesthesia Type: General  Level of Consciousness: awake, alert  and patient cooperative  Airway and Oxygen Therapy: Patient Spontanous Breathing and Patient connected to supplemental oxygen  Post-op Assessment: Post-op Vital signs reviewed, Patient's Cardiovascular Status Stable, Respiratory Function Stable, Patent Airway and No signs of Nausea or vomiting  Post-op Vital Signs: Reviewed and stable  Complications: No notable events documented.

## 2022-10-30 NOTE — Anesthesia Postprocedure Evaluation (Signed)
Anesthesia Post Note  Patient: Olivia Armstrong  Procedure(s) Performed: COLONOSCOPY WITH PROPOFOL (Rectum) POLYPECTOMY (Rectum)  Patient location during evaluation: PACU Anesthesia Type: General Level of consciousness: awake and alert Pain management: pain level controlled Vital Signs Assessment: post-procedure vital signs reviewed and stable Respiratory status: spontaneous breathing, nonlabored ventilation, respiratory function stable and patient connected to nasal cannula oxygen Cardiovascular status: blood pressure returned to baseline and stable Postop Assessment: no apparent nausea or vomiting Anesthetic complications: no   No notable events documented.   Last Vitals:  Vitals:   10/30/22 0829 10/30/22 0830  BP:  124/81  Pulse: 86 81  Resp: 20 19  Temp:    SpO2: 97% 97%    Last Pain:  Vitals:   10/30/22 0820  TempSrc: Temporal  PainSc:                  Marisue Humble

## 2022-11-02 ENCOUNTER — Encounter: Payer: Self-pay | Admitting: Gastroenterology

## 2022-11-03 ENCOUNTER — Encounter: Payer: Self-pay | Admitting: Gastroenterology

## 2022-11-24 ENCOUNTER — Encounter: Payer: Self-pay | Admitting: Adult Health

## 2022-11-24 ENCOUNTER — Ambulatory Visit (INDEPENDENT_AMBULATORY_CARE_PROVIDER_SITE_OTHER): Payer: BC Managed Care – PPO | Admitting: Adult Health

## 2022-11-24 DIAGNOSIS — F411 Generalized anxiety disorder: Secondary | ICD-10-CM

## 2022-11-24 DIAGNOSIS — G4700 Insomnia, unspecified: Secondary | ICD-10-CM | POA: Diagnosis not present

## 2022-11-24 DIAGNOSIS — F324 Major depressive disorder, single episode, in partial remission: Secondary | ICD-10-CM | POA: Diagnosis not present

## 2022-11-24 DIAGNOSIS — F428 Other obsessive-compulsive disorder: Secondary | ICD-10-CM

## 2022-11-24 NOTE — Progress Notes (Signed)
Olivia Armstrong [id number] Sep 17, 1965 57 y.o.  Subjective:   Patient ID:  Olivia Armstrong is a 57 y.o. (DOB January 30, 1966) female.  Chief Complaint: No chief complaint on file.   HPI Olivia Armstrong presents to the office today for follow-up of MDD, GAD, obsessional thoughts and acts, and insomnia.  Describes mood today as "ok". Pleasant. Denies tearfulness. Mood symptoms - reports depression, anxiety and irritability. Denies panic attacks. Reports worry, rumination, and over thinking - "trying to catch myself". Denies obsessive thoughts. Decreased irrational thinking. Mood is lower. Stating "I don't feel like I'm doing too good". Feels like the Pristiq has been helpful, but may need to be adjusted. Stable interest and motivation. Taking medications as prescribed. Energy levels lower. Active, does not have a regular exercise routine. Enjoys some usual interests and activities. Divorced. Single, not dating. Lives with cat "Eulah Pont" - 17. Recently lost cat "Ree Kida", age 64. Sister in [place] and [place]. Spending time with family. Appetite adequate. Weight stable - 160 pounds. Sleeps well most nights. Averages 6 to 7 hours. Focus and concentration "a little off". Completing tasks. Managing aspects of household. 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 Denies SI or HI.  Denies AH or VH. Denies self harm. Substance use - THC daily.  Previous medication trials: Lexapro, Ambien, Prozac, Trazadone, Zoloft   Flowsheet Row Admission (Discharged) from 10/30/2022 in [facility] PERIOP Admission (Discharged) from 10/07/2022 in [facility] PERIOPERATIVE AREA Pre-Admission Testing 43 from 09/29/2022 in [facility] PRE ADMISSION TESTING  C-SSRS RISK CATEGORY No Risk No Risk No Risk        Review of Systems:  Review of Systems  Musculoskeletal:  Negative for gait problem.  Neurological:  Negative for tremors.   Psychiatric/Behavioral:         Please refer to HPI    Medications: I have reviewed the patient's current medications.  Current Outpatient Medications  Medication Sig Dispense Refill   Brimonidine Tartrate (LUMIFY) 0.025 % SOLN Place 1 drop into both eyes daily as needed (Itchy eyes).     desvenlafaxine (PRISTIQ) 50 MG 24 hr tablet Take 1 tablet (50 mg total) by mouth daily. 90 tablet 1   estradiol (VIVELLE-DOT) 0.05 MG/24HR patch Place 1 patch onto the skin 2 (two) times a week. Change patch on Wed and Sun.     hydrocortisone 2.5 % ointment Apply 1 application topically 2 (two) times daily as needed (itching).      ibuprofen (ADVIL,MOTRIN) 200 MG tablet Take 600 mg by mouth every 6 (six) hours as needed for fever, headache or mild pain.      progesterone (PROMETRIUM) 200 MG capsule Take 200 mg by mouth at bedtime.      thyroid (ARMOUR THYROID) 30 MG tablet Take 30 mg by mouth daily before breakfast.      traMADol (ULTRAM) 50 MG tablet Take 1 tablet (50 mg total) by mouth every 6 (six) hours as needed. (Patient not taking: Reported on 10/26/2022) 20 tablet 0   tretinoin (RETIN-A) 0.05 % cream Apply 1 application topically at bedtime as needed (for skin care).     Vitamin D, Ergocalciferol, (DRISDOL) 50000 units CAPS capsule Take 50,000 Units by mouth See admin instructions. Take 1 capsule twice a week on Tuesdays and Saturdays.     zolpidem (AMBIEN) 5 MG tablet Take 5 mg by mouth at bedtime.     No current facility-administered medications for this visit.    Medication  Side Effects: None  Allergies:  Allergies  Allergen Reactions   Kiwi Extract Anaphylaxis    Itchy throat and tongue Mouth and throat swelling and itching   Mirtazapine Other (See Comments)    Dizziness    Past Medical History:  Diagnosis Date   Anxiety and depression 09/14/2013   Benign recurrent vertigo    pre - 2012.  none recently   Depression 07/30/2016   Dysrhythmia    palpitations.  no treatment. saw dr.  Elijah Birk and had stress test but no diagnosis   GERD (gastroesophageal reflux disease)    winding down on antacids   Hyperlipidemia, unspecified 09/14/2013   Hyperthyroidism    Menopausal symptoms 06/10/2016   Overview:  prometrium 200mg  daily estrdiol patch 0.05mg /day q3days   Palpitations 09/14/2013   PONV (postoperative nausea and vomiting)    Pre-diabetes    S/P trigger finger release 10/27/2013   Vitamin D deficiency     Past Medical History, Surgical history, Social history, and Family history were reviewed and updated as appropriate.   Please see review of systems for further details on the patient's review from today.   Objective:   Physical Exam:  There were no vitals taken for this visit.  Physical Exam Constitutional:      General: She is not in acute distress. Musculoskeletal:        General: No deformity.  Neurological:     Mental Status: She is alert and oriented to person, place, and time.     Coordination: Coordination normal.  Psychiatric:        Attention and Perception: Attention and perception normal. She does not perceive auditory or visual hallucinations.        Mood and Affect: Mood normal. Mood is not anxious or depressed. Affect is not labile, blunt, angry or inappropriate.        Speech: Speech normal.        Behavior: Behavior normal.        Thought Content: Thought content normal. Thought content is not paranoid or delusional. Thought content does not include homicidal or suicidal ideation. Thought content does not include homicidal or suicidal plan.        Cognition and Memory: Cognition and memory normal.        Judgment: Judgment normal.     Comments: Insight intact     Lab Review:     Component Value Date/Time   NA 139 10/02/2022 1400   K 4.1 10/02/2022 1400   CL 105 10/02/2022 1400   CO2 25 10/02/2022 1400   GLUCOSE 113 (H) 10/02/2022 1400   BUN 20 10/02/2022 1400   CREATININE 0.77 10/02/2022 1400   CALCIUM 9.2 10/02/2022 1400    GFRNONAA >60 10/02/2022 1400   GFRAA >60 01/26/2017 1218       Component Value Date/Time   WBC 8.0 10/02/2022 1400   RBC 4.52 10/02/2022 1400   HGB 13.9 10/02/2022 1400   HCT 41.0 10/02/2022 1400   PLT 269 10/02/2022 1400   MCV 90.7 10/02/2022 1400   MCH 30.8 10/02/2022 1400   MCHC 33.9 10/02/2022 1400   RDW 12.9 10/02/2022 1400    No results found for: "POCLITH", "LITHIUM"   No results found for: "PHENYTOIN", "PHENOBARB", "VALPROATE", "CBMZ"   .res Assessment: Plan:    Plan:  PDMP reviewed  Increase Pristiq 50mg  to 100mg  every morning   Ambien 5mg  at hs for sleep  Genesight testing reviewed.   RTC 3 months  Patient advised to contact office  with any questions, adverse effects, or acute worsening in signs and symptoms.  There are no diagnoses linked to this encounter.   Please see After Visit Summary for patient specific instructions.  Future Appointments  Date Time Provider Department Center  11/24/2022  5:00 PM , Thereasa Solo, NP [facility] None    No orders of the defined types were placed in this encounter.   -------------------------------

## 2022-11-25 ENCOUNTER — Other Ambulatory Visit: Payer: Self-pay | Admitting: Adult Health

## 2022-11-25 MED ORDER — DESVENLAFAXINE SUCCINATE ER 100 MG PO TB24: 100.0000 mg | ORAL_TABLET | Freq: Every day | ORAL | 0 refills | Status: DC

## 2022-11-25 NOTE — Telephone Encounter (Signed)
Tamela called in stating that at appt yesterday it was discussed a prescription to be filled for Pristiq 100mg  and she would like that to called in. Ph: [phone] appt 11/6 Pharmacy [facility] [address] [place], [place]

## 2022-11-25 NOTE — Telephone Encounter (Signed)
Pended to Gina °

## 2022-12-14 ENCOUNTER — Ambulatory Visit: Payer: BC Managed Care – PPO | Attending: Student | Admitting: Occupational Therapy

## 2022-12-14 DIAGNOSIS — M25542 Pain in joints of left hand: Secondary | ICD-10-CM

## 2022-12-14 DIAGNOSIS — M6281 Muscle weakness (generalized): Secondary | ICD-10-CM | POA: Insufficient documentation

## 2022-12-14 DIAGNOSIS — M25642 Stiffness of left hand, not elsewhere classified: Secondary | ICD-10-CM | POA: Diagnosis present

## 2022-12-19 ENCOUNTER — Encounter: Payer: Self-pay | Admitting: Occupational Therapy

## 2022-12-19 NOTE — Therapy (Signed)
[facility] [address] [place], [address] Phone: [phone]   Fax:  [phone]  Occupational Therapy Evaluation  Patient Details  Name: Olivia Armstrong MRN: [id number] Date of Birth: May 25, 1965 Referring Provider (OT): Poggi   Encounter Date: 12/14/2022   OT End of Session - 12/19/22 2053     Visit Number 1    Number of Visits 12    Date for OT Re-Evaluation 01/25/23    OT Start Time 1645    OT Stop Time 1735    OT Time Calculation (min) 50 min    Activity Tolerance Patient tolerated treatment well    Behavior During Therapy [facility] for tasks assessed/performed             Past Medical History:  Diagnosis Date   Anxiety and depression 09/14/2013   Benign recurrent vertigo    pre - 2012.  none recently   Depression 07/30/2016   Dysrhythmia    palpitations.  no treatment. saw dr. Elijah Birk and had stress test but no diagnosis   GERD (gastroesophageal reflux disease)    winding down on antacids   Hyperlipidemia, unspecified 09/14/2013   Hyperthyroidism    Menopausal symptoms 06/10/2016   Overview:  prometrium 200mg  daily estrdiol patch 0.05mg /day q3days   Palpitations 09/14/2013   PONV (postoperative nausea and vomiting)    Pre-diabetes    S/P trigger finger release 10/27/2013   Vitamin D deficiency     Past Surgical History:  Procedure Laterality Date   CARPAL TUNNEL RELEASE Left 01/05/2018   Procedure: CARPAL TUNNEL RELEASE ENDOSCOPIC;  Surgeon: Christena Flake, MD;  Location: [facility];  Service: Orthopedics;  Laterality: Left;   CARPOMETACARPAL (CMC) FUSION OF THUMB Left 10/07/2022   Procedure: SUSPENSION ARTHROPLASTY OF LEFT THUMB CMC JOINT;  Surgeon: Christena Flake, MD;  Location: [facility] OR[facility];  Service: Orthopedics;  Laterality: Left;   CHOLECYSTECTOMY N/A 10/07/2018   Procedure: LAPAROSCOPIC CHOLECYSTECTOMY;  Surgeon: Carolan Shiver, MD;  Location: [facility];  Service: General;   Laterality: N/A;   COLONOSCOPY  09/2012   COLONOSCOPY WITH PROPOFOL N/A 10/15/2017   Procedure: COLONOSCOPY WITH PROPOFOL;  Surgeon: Midge Minium, MD;  Location: [facility];  Service: Endoscopy;  Laterality: N/A;   COLONOSCOPY WITH PROPOFOL N/A 10/30/2022   Procedure: COLONOSCOPY WITH PROPOFOL;  Surgeon: Midge Minium, MD;  Location: [facility];  Service: Endoscopy;  Laterality: N/A;   ESOPHAGOGASTRODUODENOSCOPY (EGD) WITH PROPOFOL N/A 10/15/2017   Procedure: ESOPHAGOGASTRODUODENOSCOPY (EGD) WITH PROPOFOL;  Surgeon: Midge Minium, MD;  Location: [facility];  Service: Endoscopy;  Laterality: N/A;   HYSTEROSCOPY WITH D & C N/A 02/05/2017   Procedure: DILATATION AND CURETTAGE /HYSTEROSCOPY;  Surgeon: Ward, Elenora Fender, MD;  Location: [facility];  Service: Gynecology;  Laterality: N/A;   POLYPECTOMY N/A 10/15/2017   Procedure: POLYPECTOMY;  Surgeon: Midge Minium, MD;  Location: [facility];  Service: Endoscopy;  Laterality: N/A;   POLYPECTOMY  10/30/2022   Procedure: POLYPECTOMY;  Surgeon: Midge Minium, MD;  Location: [facility];  Service: Endoscopy;;   TRIGGER FINGER RELEASE Left 2015   thumb, under local    There were no vitals filed for this visit.   Subjective Assessment - 12/19/22 2047     Subjective  Pt reports prior to surgery she had CMC joint pain, had steroid injections but continued to have increased pain, edema and eventually opted for surgery.    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 Joice Lofts for suspension arthroplasty of left thumb  CMC joint.  Pt referred to OT for evaluation and treatment.    Patient Stated Goals Pt reports she would like to be able to use her hand without pain or issues, be independent with all tasks.    Currently in Pain? 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  She uses a computer a lot, typing, use of telephone and texting.  Limits her use of phone after work hours.  She is able to complete her basic self care tasks, difficulty with opening jars and containers, lifting heavier items.      AROM   Left Wrist Extension 70 Degrees    Left Wrist Flexion 65 Degrees      Left Hand AROM   L Thumb MCP 0-60 38 Degrees    L Thumb IP 0-80 40 Degrees    L Thumb Radial ADduction/ABduction 0-55 40    L Thumb Palmar ADduction/ABduction 0-45 40            Pt seen this date for use of contrast for edema control, 8 mins alternating between warm for 2 mins, cold for 1 min.  Pt to perform at home 2-3 times a day, ending with warm prior to performing exercises.    Manual Therapy: Pt educated on scar massage to left hand scar to decrease adhesions, increase tissue mobility. Issued [employer] scar pad to wear at night to promote further healing and hydration to scar.    Therapeutic Exercises:  Pt instructed on tendon gliding exercises, palmar abduction, radial abduction and thumb flex/ext  May issue left med comfort cool splint for [facility] support next session (size not in stock)    OT Education - 12/19/22 2102     Education Details contrast, scar massage, exercises    Person(s) Educated Patient    Methods Explanation;Demonstration;Handout    Comprehension Verbalized understanding              OT Short Term Goals - 12/19/22 2056       OT SHORT TERM GOAL #1   Title Pt will be independent with use of contrast for edema control.    Baseline no current knowledge    Time 3    Period Weeks    Status New    Target Date 01/04/23               OT Long Term Goals -  12/19/22 2056       OT LONG TERM GOAL #1   Title Pt will demonstrate understanding of scar management techniques for left hand    Baseline no current knowledge    Time 3    Period Weeks    Status New    Target Date 01/04/23      OT LONG TERM GOAL #2   Title Pt will demonstrate improved ROM of left hand to within normal limits to use for daily tasks without pain  or limitations.    Baseline flexion of MC thumb 38, IP 40    Time 6    Period Weeks    Status New    Target Date 01/25/23      OT LONG TERM GOAL #3   Title Pt will demonstrate abiity to perform opening of jars and containers without pain or difficulty.    Baseline some mild difficulty with opening jars and containers at eval    Time 6    Period Weeks    Status New    Target Date 01/25/23      OT LONG TERM GOAL #4   Title Pt will demonstrate the ability to lift and carry 8# or greater object without difficulty or pain.    Baseline difficulty with lifting heavy objects at eval    Time 6    Period Weeks    Status New    Target Date 01/25/23                   Plan - 12/19/22 2107     Clinical Impression Statement Pt is a 57 yo female referred to OT for evaluation and treatment following diagnosis of degenerative joint disease of her CMC left thumb and underwent surgical intervention for suspension arthroplasty of CMC left thumb joint on 10/07/2022.  She presents today and is 9.5 weeks post surgery.  She denies pain this date.  She was fitted for a hard brace she used for 3 weeks and has a soft brace to use at night.  She demonstrates decreased ROM, strength, functional use of left dominant hand and mild decreased sensation with some slight numbness along the thumb.  She would benefit from skilled OT services to maximize safety and independence in necessary daily tasks.    OT Occupational Profile and History Detailed Assessment- Review of Records and additional review of physical, cognitive, psychosocial history  related to current functional performance    Occupational performance deficits (Please refer to evaluation for details): ADL's;Work;IADL's;Leisure    Body Structure / Function / Physical Skills ADL;Edema;Flexibility;ROM;UE functional use;FMC;Pain;Scar mobility;Strength;Coordination;Dexterity;IADL;Sensation    Psychosocial Skills Environmental  Adaptations;Habits;Routines and Behaviors    Rehab Potential Excellent    Clinical Decision Making Several treatment options, min-mod task modification necessary    Comorbidities Affecting Occupational Performance: May have comorbidities impacting occupational performance    Modification or Assistance to Complete Evaluation  No modification of tasks or assist necessary to complete eval    OT Frequency 2x / week    OT Duration 6 weeks    OT Treatment/Interventions Self-care/ADL training;Fluidtherapy;Therapeutic exercise;Scar mobilization;Cryotherapy;Moist Heat;Ultrasound;Paraffin;Contrast Bath;Neuromuscular education;DME and/or AE instruction;Manual Therapy;Passive range of motion;Splinting;Patient/family education;Therapeutic activities    Consulted and Agree with Plan of Care Patient             Patient will benefit from skilled therapeutic intervention in order to improve the following deficits and impairments:   Body Structure / Function / Physical Skills: ADL, Edema, Flexibility, ROM, UE functional use, FMC, Pain, Scar mobility, Strength, Coordination, Dexterity, IADL, Sensation   Psychosocial Skills: Environmental  Adaptations, Habits, Routines and Behaviors   Visit Diagnosis: Muscle weakness (generalized)  Stiffness of left hand, not elsewhere classified  Pain in joint of left hand    Problem List Patient Active Problem List   Diagnosis Date Noted   Personal history of colonic polyps 10/30/2022   Polyp of descending colon 10/30/2022   Carpal tunnel syndrome, right 04/28/2021   Primary osteoarthritis of first carpometacarpal joint  of left  hand 04/28/2021   Abnormal glucose 04/17/2021   Vitamin D deficiency 04/17/2021   Benign neoplasm of ascending colon 08/18/2019   Major depression in partial remission (HCC) 02/09/2018   Nausea    Diarrhea    Gastritis without bleeding    Noninfectious diarrhea    Abnormal CT scan 09/23/2017   Depression 07/30/2016   Menopausal symptoms 06/10/2016   S/P trigger finger release 10/27/2013   Anxiety and depression 09/14/2013   Acquired hypothyroidism 09/14/2013   Hyperlipidemia, unspecified 09/14/2013   Palpitations 09/14/2013   Koren Plyler T Una Yeomans, OTR/L, CLT  Kmari Brian, OT 12/19/2022, 9:21 PM  [facility] [address] [place], [address] Phone: [phone]   Fax:  [phone]  Name: Olivia Armstrong MRN: [id number] Date of Birth: 09-28-1965

## 2022-12-22 ENCOUNTER — Ambulatory Visit: Payer: BC Managed Care – PPO | Attending: Student | Admitting: Occupational Therapy

## 2022-12-22 DIAGNOSIS — M25542 Pain in joints of left hand: Secondary | ICD-10-CM | POA: Diagnosis present

## 2022-12-22 DIAGNOSIS — M25642 Stiffness of left hand, not elsewhere classified: Secondary | ICD-10-CM | POA: Diagnosis present

## 2022-12-22 DIAGNOSIS — M6281 Muscle weakness (generalized): Secondary | ICD-10-CM | POA: Insufficient documentation

## 2022-12-22 DIAGNOSIS — L905 Scar conditions and fibrosis of skin: Secondary | ICD-10-CM | POA: Diagnosis present

## 2022-12-24 ENCOUNTER — Encounter: Payer: Self-pay | Admitting: Occupational Therapy

## 2022-12-24 ENCOUNTER — Ambulatory Visit: Payer: BC Managed Care – PPO | Admitting: Occupational Therapy

## 2022-12-24 DIAGNOSIS — M25642 Stiffness of left hand, not elsewhere classified: Secondary | ICD-10-CM

## 2022-12-24 DIAGNOSIS — L905 Scar conditions and fibrosis of skin: Secondary | ICD-10-CM

## 2022-12-24 DIAGNOSIS — M6281 Muscle weakness (generalized): Secondary | ICD-10-CM | POA: Diagnosis not present

## 2022-12-24 DIAGNOSIS — M25542 Pain in joints of left hand: Secondary | ICD-10-CM

## 2022-12-24 NOTE — Therapy (Signed)
[facility] [address] [place], [address] Phone: [phone]   Fax:  [phone]  Occupational Therapy Treatment  Patient Details  Name: Olivia Armstrong MRN: [id number] Date of Birth: 03/26/1966 Referring Provider (OT): Poggi   Encounter Date: 12/22/2022   OT End of Session - 12/24/22 1118     Visit Number 2    Number of Visits 12    Date for OT Re-Evaluation 01/25/23    OT Start Time 1615    OT Stop Time 1710    OT Time Calculation (min) 55 min    Activity Tolerance Patient tolerated treatment well    Behavior During Therapy [facility] for tasks assessed/performed             Past Medical History:  Diagnosis Date   Anxiety and depression 09/14/2013   Benign recurrent vertigo    pre - 2012.  none recently   Depression 07/30/2016   Dysrhythmia    palpitations.  no treatment. saw dr. Elijah Birk and had stress test but no diagnosis   GERD (gastroesophageal reflux disease)    winding down on antacids   Hyperlipidemia, unspecified 09/14/2013   Hyperthyroidism    Menopausal symptoms 06/10/2016   Overview:  prometrium 200mg  daily estrdiol patch 0.05mg /day q3days   Palpitations 09/14/2013   PONV (postoperative nausea and vomiting)    Pre-diabetes    S/P trigger finger release 10/27/2013   Vitamin D deficiency     Past Surgical History:  Procedure Laterality Date   CARPAL TUNNEL RELEASE Left 01/05/2018   Procedure: CARPAL TUNNEL RELEASE ENDOSCOPIC;  Surgeon: Christena Flake, MD;  Location: [facility];  Service: Orthopedics;  Laterality: Left;   CARPOMETACARPAL (CMC) FUSION OF THUMB Left 10/07/2022   Procedure: SUSPENSION ARTHROPLASTY OF LEFT THUMB CMC JOINT;  Surgeon: Christena Flake, MD;  Location: [facility] ORS;  Service: Orthopedics;  Laterality: Left;   CHOLECYSTECTOMY N/A 10/07/2018   Procedure: LAPAROSCOPIC CHOLECYSTECTOMY;  Surgeon: Carolan Shiver, MD;  Location: [facility] ORS;  Service: General;   Laterality: N/A;   COLONOSCOPY  09/2012   COLONOSCOPY WITH PROPOFOL N/A 10/15/2017   Procedure: COLONOSCOPY WITH PROPOFOL;  Surgeon: Midge Minium, MD;  Location: [facility];  Service: Endoscopy;  Laterality: N/A;   COLONOSCOPY WITH PROPOFOL N/A 10/30/2022   Procedure: COLONOSCOPY WITH PROPOFOL;  Surgeon: Midge Minium, MD;  Location: [facility];  Service: Endoscopy;  Laterality: N/A;   ESOPHAGOGASTRODUODENOSCOPY (EGD) WITH PROPOFOL N/A 10/15/2017   Procedure: ESOPHAGOGASTRODUODENOSCOPY (EGD) WITH PROPOFOL;  Surgeon: Midge Minium, MD;  Location: [facility];  Service: Endoscopy;  Laterality: N/A;   HYSTEROSCOPY WITH D & C N/A 02/05/2017   Procedure: DILATATION AND CURETTAGE /HYSTEROSCOPY;  Surgeon: Ward, Elenora Fender, MD;  Location: [facility] ORS;  Service: Gynecology;  Laterality: N/A;   POLYPECTOMY N/A 10/15/2017   Procedure: POLYPECTOMY;  Surgeon: Midge Minium, MD;  Location: [facility];  Service: Endoscopy;  Laterality: N/A;   POLYPECTOMY  10/30/2022   Procedure: POLYPECTOMY;  Surgeon: Midge Minium, MD;  Location: [facility];  Service: Endoscopy;;   TRIGGER FINGER RELEASE Left 2015   thumb, under local    There were no vitals filed for this visit.   Subjective Assessment - 12/24/22 1118     Subjective  Pt reports she is doing well, has been doing exercises and scar massage at home.    Pertinent History Pt diagnosed with Degenerative joint disease of left thumb CMC joint and underwent surgical intervention on 10/07/2022,  Dr. Joice Lofts for suspension arthroplasty of left thumb  CMC joint.  Pt referred to OT for evaluation and treatment.    Patient Stated Goals Pt reports she would like to be able to use her hand without pain or issues, be independent with all tasks.    Currently in Pain? No/denies    Multiple Pain Sites No             Fluido therapy:   Pt seen for use of fluido therapy for 12 mins total, alternating fluido 3 mins with 1 min of ice for a  contrast effect to decrease edema, increase tissue mobility and improve ROM.  Pt performing ROM of thumb and wrist during fluido.    Manual Therapy: Pt seen for manual therapy for focus on scar massage to left hand scar to decrease adhesions, increase tissue mobility.  Use of xtractor for scar management to decrease adhesions.  Cica Care scar pad to wear at night to promote further healing and hydration to scar.     Therapeutic Exercises:  Tendon gliding exercises, palmar abduction, radial abduction and thumb flex/ext. Opposition to each digit with full extension with each rep, pain free.   Pt does a bit of typing for her job, decreased coordination with thumb with this task.     May issue left med comfort cool splint for [facility] support next session if need for thumb support during any heavier daily tasks.      OT Education - 12/24/22 1118     Education Details contrast, scar massage, exercises    Person(s) Educated Patient    Methods Explanation;Demonstration;Handout    Comprehension Verbalized understanding              OT Short Term Goals - 12/19/22 2056       OT SHORT TERM GOAL #1   Title Pt will be independent with use of contrast for edema control.    Baseline no current knowledge    Time 3    Period Weeks    Status New    Target Date 01/04/23               OT Long Term Goals - 12/19/22 2056       OT LONG TERM GOAL #1   Title Pt will demonstrate understanding of scar management techniques for left hand    Baseline no current knowledge    Time 3    Period Weeks    Status New    Target Date 01/04/23      OT LONG TERM GOAL #2   Title Pt will demonstrate improved ROM of left hand to within normal limits to use for daily tasks without pain or limitations.    Baseline flexion of MC thumb 38, IP 40    Time 6    Period Weeks    Status New    Target Date 01/25/23      OT LONG TERM GOAL #3   Title Pt will demonstrate abiity to perform opening of jars and  containers without pain or difficulty.    Baseline some mild difficulty with opening jars and containers at eval    Time 6    Period Weeks    Status New    Target Date 01/25/23      OT LONG TERM GOAL #4   Title Pt will demonstrate the ability to lift and carry 8# or greater object without difficulty or pain.    Baseline difficulty with lifting heavy objects at  eval    Time 6    Period Weeks    Status New    Target Date 01/25/23                   Plan - 12/24/22 1119     Clinical Impression Statement Pt is a 57 yo female referred to OT for evaluation and treatment following diagnosis of degenerative joint disease of her CMC left thumb and underwent surgical intervention for suspension arthroplasty of CMC left thumb joint on 10/07/2022.  She presents today and is 10 weeks post surgery.  She denies pain this date.  She was fitted for a hard brace she used for 3 weeks and has a soft brace to use at night. Pt progressing with use of contrast and demonstrates decreased edema in left thumb area, continued focus on scar massage and scar management with use of extractor and Cica care at night.  Improved ROM and responded well to use of fluido therapy this date.  She demonstrates decreased ROM, strength, functional use of left dominant hand and mild decreased sensation with some slight numbness along the thumb.  She would benefit from skilled OT services to maximize safety and independence in necessary daily tasks.    OT Occupational Profile and History Detailed Assessment- Review of Records and additional review of physical, cognitive, psychosocial history related to current functional performance    Occupational performance deficits (Please refer to evaluation for details): ADL's;Work;IADL's;Leisure    Body Structure / Function / Physical Skills ADL;Edema;Flexibility;ROM;UE functional use;FMC;Pain;Scar mobility;Strength;Coordination;Dexterity;IADL;Sensation    Psychosocial Skills Environmental   Adaptations;Habits;Routines and Behaviors    Rehab Potential Excellent    Clinical Decision Making Several treatment options, min-mod task modification necessary    Comorbidities Affecting Occupational Performance: May have comorbidities impacting occupational performance    Modification or Assistance to Complete Evaluation  No modification of tasks or assist necessary to complete eval    OT Frequency 2x / week    OT Duration 6 weeks    OT Treatment/Interventions Self-care/ADL training;Fluidtherapy;Therapeutic exercise;Scar mobilization;Cryotherapy;Moist Heat;Ultrasound;Paraffin;Contrast Bath;Neuromuscular education;DME and/or AE instruction;Manual Therapy;Passive range of motion;Splinting;Patient/family education;Therapeutic activities    Consulted and Agree with Plan of Care Patient             Patient will benefit from skilled therapeutic intervention in order to improve the following deficits and impairments:   Body Structure / Function / Physical Skills: ADL, Edema, Flexibility, ROM, UE functional use, FMC, Pain, Scar mobility, Strength, Coordination, Dexterity, IADL, Sensation   Psychosocial Skills: Environmental  Adaptations, Habits, Routines and Behaviors   Visit Diagnosis: Muscle weakness (generalized)  Stiffness of left hand, not elsewhere classified  Pain in joint of left hand  Scar condition and fibrosis of skin    Problem List Patient Active Problem List   Diagnosis Date Noted   Personal history of colonic polyps 10/30/2022   Polyp of descending colon 10/30/2022   Carpal tunnel syndrome, right 04/28/2021   Primary osteoarthritis of first carpometacarpal joint of left hand 04/28/2021   Abnormal glucose 04/17/2021   Vitamin D deficiency 04/17/2021   Benign neoplasm of ascending colon 08/18/2019   Major depression in partial remission (HCC) 02/09/2018   Nausea    Diarrhea    Gastritis without bleeding    Noninfectious diarrhea    Abnormal CT scan 09/23/2017    Depression 07/30/2016   Menopausal symptoms 06/10/2016   S/P trigger finger release 10/27/2013   Anxiety and depression 09/14/2013   Acquired hypothyroidism 09/14/2013   Hyperlipidemia, unspecified  09/14/2013   Palpitations 09/14/2013   Marlos Carmen T Arne Cleveland, OTR/L, CLT  Ethridge Sollenberger, OT 12/24/2022, 4:03 PM  [facility] [address] [place], [address] Phone: [phone]   Fax:  [phone]  Name: KYIESHA CONTINO MRN: [id number] Date of Birth: 09-27-1965

## 2022-12-24 NOTE — Therapy (Signed)
[facility] [address], [place], [address] Phone: [phone]   Fax:  [phone]  Occupational Therapy Treatment  Patient Details  Name: TRINETTE LEDEE MRN: [id number] Date of Birth: 59-Nov-1967 Referring Provider (OT): Poggi   Encounter Date: 12/24/2022   OT End of Session - 12/24/22 1621     Visit Number 3    Number of Visits 12    Date for OT Re-Evaluation 01/25/23    OT Start Time 1621    OT Stop Time 1706    OT Time Calculation (min) 45 min    Activity Tolerance Patient tolerated treatment well    Behavior During Therapy [facility] for tasks assessed/performed             Past Medical History:  Diagnosis Date   Anxiety and depression 09/14/2013   Benign recurrent vertigo    pre - 2012.  none recently   Depression 07/30/2016   Dysrhythmia    palpitations.  no treatment. saw dr. Elijah Birk and had stress test but no diagnosis   GERD (gastroesophageal reflux disease)    winding down on antacids   Hyperlipidemia, unspecified 09/14/2013   Hyperthyroidism    Menopausal symptoms 06/10/2016   Overview:  prometrium 200mg  daily estrdiol patch 0.05mg /day q3days   Palpitations 09/14/2013   PONV (postoperative nausea and vomiting)    Pre-diabetes    S/P trigger finger release 10/27/2013   Vitamin D deficiency     Past Surgical History:  Procedure Laterality Date   CARPAL TUNNEL RELEASE Left 01/05/2018   Procedure: CARPAL TUNNEL RELEASE ENDOSCOPIC;  Surgeon: Christena Flake, MD;  Location: [facility];  Service: Orthopedics;  Laterality: Left;   CARPOMETACARPAL (CMC) FUSION OF THUMB Left 10/07/2022   Procedure: SUSPENSION ARTHROPLASTY OF LEFT THUMB CMC JOINT;  Surgeon: Christena Flake, MD;  Location: [facility] ORS;  Service: Orthopedics;  Laterality: Left;   CHOLECYSTECTOMY N/A 10/07/2018   Procedure: LAPAROSCOPIC CHOLECYSTECTOMY;  Surgeon: Carolan Shiver, MD;  Location: [facility] OR[facility];  Service: General;   Laterality: N/A;   COLONOSCOPY  09/2012   COLONOSCOPY WITH PROPOFOL N/A 10/15/2017   Procedure: COLONOSCOPY WITH PROPOFOL;  Surgeon: Midge Minium, MD;  Location: [facility];  Service: Endoscopy;  Laterality: N/A;   COLONOSCOPY WITH PROPOFOL N/A 10/30/2022   Procedure: COLONOSCOPY WITH PROPOFOL;  Surgeon: Midge Minium, MD;  Location: [facility];  Service: Endoscopy;  Laterality: N/A;   ESOPHAGOGASTRODUODENOSCOPY (EGD) WITH PROPOFOL N/A 10/15/2017   Procedure: ESOPHAGOGASTRODUODENOSCOPY (EGD) WITH PROPOFOL;  Surgeon: Midge Minium, MD;  Location: [facility];  Service: Endoscopy;  Laterality: N/A;   HYSTEROSCOPY WITH D & C N/A 02/05/2017   Procedure: DILATATION AND CURETTAGE /HYSTEROSCOPY;  Surgeon: Ward, Elenora Fender, MD;  Location: [facility] ORS;  Service: Gynecology;  Laterality: N/A;   POLYPECTOMY N/A 10/15/2017   Procedure: POLYPECTOMY;  Surgeon: Midge Minium, MD;  Location: [facility];  Service: Endoscopy;  Laterality: N/A;   POLYPECTOMY  10/30/2022   Procedure: POLYPECTOMY;  Surgeon: Midge Minium, MD;  Location: [facility];  Service: Endoscopy;;   TRIGGER FINGER RELEASE Left 2015   thumb, under local    There were no vitals filed for this visit.   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 Joice Lofts for suspension  arthroplasty of left thumb  CMC joint.  Pt referred to OT for evaluation and treatment.    Patient Stated Goals Pt reports she would like to be able to use her hand without pain or issues, be independent with all tasks.    Currently in Pain? No/denies                [facility] OT Assessment - 12/24/22 0001       Strength   Right Hand Grip (lbs) 60    Right Hand Lateral Pinch 14 lbs    Right Hand 3 Point Pinch 15 lbs    Left Hand  Grip (lbs) 55    Left Hand Lateral Pinch 9 lbs    Left Hand 3 Point Pinch 11 lbs      Left Hand AROM   L Thumb Radial ADduction/ABduction 0-55 50    L Thumb Palmar ADduction/ABduction 0-45 55    L Thumb Opposition to Index --   Opposition to 2nd fold of 5th                     OT Treatments/Exercises (OP) - 12/24/22 0001       LUE Fluidotherapy   Number Minutes Fluidotherapy 8 Minutes    LUE Fluidotherapy Location Wrist;Hand    Comments prior to upgrade of HEP - decrease stiffness             Manual Therapy: Pt seen for manual therapy for focus on scar massage to left hand scar to decrease adhesions, increase tissue mobility.  With focus of thumb in RA -proximal adhesion Cica Care scar pad to wear at night to promote further healing and hydration to scar.     Therapeutic Exercises:  Cont AROM palmar abduction, radial abduction and thumb flex/ext.  Opposition to each digit with full extension with each rep, pain free.  And can slide down 4th and if pain free- down 5th  Keeping CMC out of CT - doing great  After fluido - upgrade and add rubber band for PA and RA 2 x 15 reps for few days  If pain free can increase to 3rd set 2-3 x day    Fitted pt with left med comfort cool splint for [facility] support  during any heavier daily tasks.      Pt decrease to 1 x wk       OT Education - 12/24/22 1621     Education Details progress and changes to HEP    Person(s) Educated Patient    Methods Explanation;Demonstration;Handout    Comprehension Verbalized understanding              OT Short Term Goals - 12/19/22 2056       OT SHORT TERM GOAL #1   Title Pt will be independent with use of contrast for edema control.    Baseline no current knowledge    Time 3    Period Weeks    Status New    Target Date 01/04/23               OT Long Term Goals - 12/19/22 2056       OT LONG TERM GOAL #1   Title Pt will demonstrate understanding of scar management  techniques for left hand    Baseline no current knowledge    Time 3    Period Weeks    Status New    Target Date 01/04/23      OT LONG  TERM GOAL #2   Title Pt will demonstrate improved ROM of left hand to within normal limits to use for daily tasks without pain or limitations.    Baseline flexion of MC thumb 38, IP 40    Time 6    Period Weeks    Status New    Target Date 01/25/23      OT LONG TERM GOAL #3   Title Pt will demonstrate abiity to perform opening of jars and containers without pain or difficulty.    Baseline some mild difficulty with opening jars and containers at eval    Time 6    Period Weeks    Status New    Target Date 01/25/23      OT LONG TERM GOAL #4   Title Pt will demonstrate the ability to lift and carry 8# or greater object without difficulty or pain.    Baseline difficulty with lifting heavy objects at eval    Time 6    Period Weeks    Status New    Target Date 01/25/23                   Plan - 12/24/22 1622     Clinical Impression Statement Pt is a 57 yo female referred to OT for evaluation and treatment following diagnosis of degenerative joint disease of her CMC left thumb and underwent surgical intervention for suspension arthroplasty of CMC left thumb joint on 10/07/2022.  She 11 weeks post surgery.  She denies pain this date.  Pt progressing in edema, ROM  and scar tissue. Initiated strengthening for thumb PA and RA this date   She demonstrates decreased ROM, strength, functional use of left dominant hand. She would benefit from skilled OT services to maximize safety and independence in necessary daily tasks.    OT Occupational Profile and History Detailed Assessment- Review of Records and additional review of physical, cognitive, psychosocial history related to current functional performance    Occupational performance deficits (Please refer to evaluation for details): ADL's;Work;IADL's;Leisure    Body Structure / Function / Physical  Skills ADL;Edema;Flexibility;ROM;UE functional use;FMC;Pain;Scar mobility;Strength;Coordination;Dexterity;IADL;Sensation    Psychosocial Skills Environmental  Adaptations;Habits;Routines and Behaviors    Rehab Potential Excellent    Clinical Decision Making Several treatment options, min-mod task modification necessary    Comorbidities Affecting Occupational Performance: May have comorbidities impacting occupational performance    Modification or Assistance to Complete Evaluation  No modification of tasks or assist necessary to complete eval    OT Frequency 1x / week    OT Duration 6 weeks    OT Treatment/Interventions Self-care/ADL training;Fluidtherapy;Therapeutic exercise;Scar mobilization;Cryotherapy;Moist Heat;Ultrasound;Paraffin;Contrast Bath;Neuromuscular education;DME and/or AE instruction;Manual Therapy;Passive range of motion;Splinting;Patient/family education;Therapeutic activities             Patient will benefit from skilled therapeutic intervention in order to improve the following deficits and impairments:   Body Structure / Function / Physical Skills: ADL, Edema, Flexibility, ROM, UE functional use, FMC, Pain, Scar mobility, Strength, Coordination, Dexterity, IADL, Sensation   Psychosocial Skills: Environmental  Adaptations, Habits, Routines and Behaviors   Visit Diagnosis: Muscle weakness (generalized)  Stiffness of left hand, not elsewhere classified  Pain in joint of left hand  Scar condition and fibrosis of skin    Problem List Patient Active Problem List   Diagnosis Date Noted   Personal history of colonic polyps 10/30/2022   Polyp of descending colon 10/30/2022   Carpal tunnel syndrome, right 04/28/2021   Primary osteoarthritis of first carpometacarpal joint  of left hand 04/28/2021   Abnormal glucose 04/17/2021   Vitamin D deficiency 04/17/2021   Benign neoplasm of ascending colon 08/18/2019   Major depression in partial remission (HCC) 02/09/2018    Nausea    Diarrhea    Gastritis without bleeding    Noninfectious diarrhea    Abnormal CT scan 09/23/2017   Depression 07/30/2016   Menopausal symptoms 06/10/2016   S/P trigger finger release 10/27/2013   Anxiety and depression 09/14/2013   Acquired hypothyroidism 09/14/2013   Hyperlipidemia, unspecified 09/14/2013   Palpitations 09/14/2013    Oletta Cohn, OTR/L,CLT 12/24/2022, 6:49 PM  [place]  [facility] [address] [place], [address] Phone: [phone]   Fax:  [phone]  Name: OLUBUNMI UHER MRN: [id number] Date of Birth: [birth year]

## 2022-12-28 ENCOUNTER — Ambulatory Visit: Payer: BC Managed Care – PPO | Admitting: Occupational Therapy

## 2022-12-31 ENCOUNTER — Ambulatory Visit: Payer: BC Managed Care – PPO | Admitting: Occupational Therapy

## 2022-12-31 DIAGNOSIS — M6281 Muscle weakness (generalized): Secondary | ICD-10-CM

## 2022-12-31 DIAGNOSIS — M25542 Pain in joints of left hand: Secondary | ICD-10-CM

## 2022-12-31 DIAGNOSIS — L905 Scar conditions and fibrosis of skin: Secondary | ICD-10-CM

## 2022-12-31 DIAGNOSIS — M25642 Stiffness of left hand, not elsewhere classified: Secondary | ICD-10-CM

## 2022-12-31 NOTE — Therapy (Signed)
[facility] [address] [place], [address] Phone: [phone]   Fax:  [phone]  Occupational Therapy Treatment  Patient Details  Name: Olivia Armstrong MRN: [id number] Date of Birth: Nov 24, 1965 Referring Provider (OT): Poggi   Encounter Date: 12/31/2022   OT End of Session - 12/31/22 1709     Visit Number 4    Number of Visits 12    Date for OT Re-Evaluation 01/25/23    OT Start Time 1620    OT Stop Time 1705    OT Time Calculation (min) 45 min    Activity Tolerance Patient tolerated treatment well    Behavior During Therapy [facility] for tasks assessed/performed             Past Medical History:  Diagnosis Date   Anxiety and depression 09/14/2013   Benign recurrent vertigo    pre - 2012.  none recently   Depression 07/30/2016   Dysrhythmia    palpitations.  no treatment. saw dr. Elijah Birk and had stress test but no diagnosis   GERD (gastroesophageal reflux disease)    winding down on antacids   Hyperlipidemia, unspecified 09/14/2013   Hyperthyroidism    Menopausal symptoms 06/10/2016   Overview:  prometrium 200mg  daily estrdiol patch 0.05mg /day q3days   Palpitations 09/14/2013   PONV (postoperative nausea and vomiting)    Pre-diabetes    S/P trigger finger release 10/27/2013   Vitamin D deficiency     Past Surgical History:  Procedure Laterality Date   CARPAL TUNNEL RELEASE Left 01/05/2018   Procedure: CARPAL TUNNEL RELEASE ENDOSCOPIC;  Surgeon: Christena Flake, MD;  Location: [facility];  Service: Orthopedics;  Laterality: Left;   CARPOMETACARPAL (CMC) FUSION OF THUMB Left 10/07/2022   Procedure: SUSPENSION ARTHROPLASTY OF LEFT THUMB CMC JOINT;  Surgeon: Christena Flake, MD;  Location: [facility] ORS;  Service: Orthopedics;  Laterality: Left;   CHOLECYSTECTOMY N/A 10/07/2018   Procedure: LAPAROSCOPIC CHOLECYSTECTOMY;  Surgeon: Carolan Shiver, MD;  Location: [facility] ORS;  Service: General;   Laterality: N/A;   COLONOSCOPY  09/2012   COLONOSCOPY WITH PROPOFOL N/A 10/15/2017   Procedure: COLONOSCOPY WITH PROPOFOL;  Surgeon: Midge Minium, MD;  Location: [facility];  Service: Endoscopy;  Laterality: N/A;   COLONOSCOPY WITH PROPOFOL N/A 10/30/2022   Procedure: COLONOSCOPY WITH PROPOFOL;  Surgeon: Midge Minium, MD;  Location: [facility];  Service: Endoscopy;  Laterality: N/A;   ESOPHAGOGASTRODUODENOSCOPY (EGD) WITH PROPOFOL N/A 10/15/2017   Procedure: ESOPHAGOGASTRODUODENOSCOPY (EGD) WITH PROPOFOL;  Surgeon: Midge Minium, MD;  Location: [facility];  Service: Endoscopy;  Laterality: N/A;   HYSTEROSCOPY WITH D & C N/A 02/05/2017   Procedure: DILATATION AND CURETTAGE /HYSTEROSCOPY;  Surgeon: Ward, Elenora Fender, MD;  Location: [facility] OR[facility];  Service: Gynecology;  Laterality: N/A;   POLYPECTOMY N/A 10/15/2017   Procedure: POLYPECTOMY;  Surgeon: Midge Minium, MD;  Location: [facility];  Service: Endoscopy;  Laterality: N/A;   POLYPECTOMY  10/30/2022   Procedure: POLYPECTOMY;  Surgeon: Midge Minium, MD;  Location: [facility];  Service: Endoscopy;;   TRIGGER FINGER RELEASE Left 2015   thumb, under local    There were no vitals filed for this visit.   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 Joice Lofts for suspension arthroplasty of left thumb  CMC joint.  Pt referred to OT for evaluation and treatment.    Patient Stated Goals Pt reports she would like to be able to use her hand without pain or issues, be independent with all tasks.    Currently in Pain? No/denies                [facility] OT Assessment - 12/31/22 0001       Strength   Right Hand Grip (lbs) 60    Right Hand Lateral Pinch 14 lbs     Right Hand 3 Point Pinch 15 lbs    Left Hand Grip (lbs) 55    Left Hand Lateral Pinch 11 lbs    Left Hand 3 Point Pinch 11 lbs      Left Hand AROM   L Thumb Radial ADduction/ABduction 0-55 50    L Thumb Palmar ADduction/ABduction 0-45 55    L Thumb Opposition to Index --   Opposition to 2nd fold 5th                     OT Treatments/Exercises (OP) - 12/31/22 0001       LUE Fluidotherapy   Number Minutes Fluidotherapy 8 Minutes    LUE Fluidotherapy Location Wrist;Hand    Comments decrease stiffness prior to strengthening            Manual Therapy: Pt seen for manual therapy for focus on scar massage to left hand scar to decrease adhesions, increase tissue mobility.  With focus of thumb in RA - great improvement in proximal adhesion [employer] scar pad to wear at night to promote further healing and hydration to scar.     Therapeutic Exercises:  Cont AROM palmar abduction, radial abduction and thumb flex/ext.  Opposition to each digit with full extension with each rep, pain free.  And can slide down 4th and if pain free- down 5th  Keeping CMC out of CT - doing great  After fluido - review rubber band for PA and RA 2 x 15 reps for 2-3 x day   If pain free can increase to 3rd set 2-3 x day Add teal med putty for gripping , lat and 3 point pinch pain free 15 reps  2 x day -increase to 2nd and 3rd set over the next 2 wks pain free      Cont with left med comfort cool splint for [facility] support  during any heavier daily tasks.   For functional strengthening            OT Education - 12/31/22 1709     Education Details progress and changes to HEP    Person(s) Educated Patient    Methods Explanation;Demonstration;Handout    Comprehension Verbalized understanding              OT Short Term Goals - 12/19/22 2056       OT SHORT TERM GOAL #1   Title Pt will be independent with use of contrast for edema control.    Baseline no current knowledge    Time 3     Period Weeks    Status New    Target Date 01/04/23               OT Long Term Goals - 12/19/22 2056       OT LONG TERM GOAL #1   Title Pt will demonstrate understanding of scar management  techniques for left hand    Baseline no current knowledge    Time 3    Period Weeks    Status New    Target Date 01/04/23      OT LONG TERM GOAL #2   Title Pt will demonstrate improved ROM of left hand to within normal limits to use for daily tasks without pain or limitations.    Baseline flexion of MC thumb 38, IP 40    Time 6    Period Weeks    Status New    Target Date 01/25/23      OT LONG TERM GOAL #3   Title Pt will demonstrate abiity to perform opening of jars and containers without pain or difficulty.    Baseline some mild difficulty with opening jars and containers at eval    Time 6    Period Weeks    Status New    Target Date 01/25/23      OT LONG TERM GOAL #4   Title Pt will demonstrate the ability to lift and carry 8# or greater object without difficulty or pain.    Baseline difficulty with lifting heavy objects at eval    Time 6    Period Weeks    Status New    Target Date 01/25/23                   Plan - 12/31/22 1710     Clinical Impression Statement Pt is a 57 yo female referred to OT for evaluation and treatment following diagnosis of degenerative joint disease of her CMC left thumb and underwent surgical intervention for suspension arthroplasty of CMC left thumb joint on 10/07/2022.  She 12 weeks post surgery.  She denies pain this date.  Great progress in pain, scar tissue and AROM - strength increasing greatly - pt provided with putty for grip and prehension to increase endurance and rubberband for thumb PA and RA .  Pt to follow up in 2 wks for possible discharge.  She would benefit from skilled OT services to maximize safety and independence in necessary daily tasks.    OT Occupational Profile and History Detailed Assessment- Review of Records and  additional review of physical, cognitive, psychosocial history related to current functional performance    Occupational performance deficits (Please refer to evaluation for details): ADL's;Work;IADL's;Leisure    Body Structure / Function / Physical Skills ADL;Edema;Flexibility;ROM;UE functional use;FMC;Pain;Scar mobility;Strength;Coordination;Dexterity;IADL;Sensation    Psychosocial Skills Environmental  Adaptations;Habits;Routines and Behaviors    Rehab Potential Excellent    Clinical Decision Making Several treatment options, min-mod task modification necessary    Comorbidities Affecting Occupational Performance: May have comorbidities impacting occupational performance    Modification or Assistance to Complete Evaluation  No modification of tasks or assist necessary to complete eval    OT Frequency 1x / week    OT Duration 4 weeks    OT Treatment/Interventions Self-care/ADL training;Fluidtherapy;Therapeutic exercise;Scar mobilization;Cryotherapy;Moist Heat;Ultrasound;Paraffin;Contrast Bath;Neuromuscular education;DME and/or AE instruction;Manual Therapy;Passive range of motion;Splinting;Patient/family education;Therapeutic activities    Consulted and Agree with Plan of Care Patient             Patient will benefit from skilled therapeutic intervention in order to improve the following deficits and impairments:   Body Structure / Function / Physical Skills: ADL, Edema, Flexibility, ROM, UE functional use, FMC, Pain, Scar mobility, Strength, Coordination, Dexterity, IADL, Sensation   Psychosocial Skills: Environmental  Adaptations, Habits, Routines and Behaviors   Visit Diagnosis: Muscle weakness (generalized)  Pain in  joint of left hand  Scar condition and fibrosis of skin  Stiffness of left hand, not elsewhere classified    Problem List Patient Active Problem List   Diagnosis Date Noted   Personal history of colonic polyps 10/30/2022   Polyp of descending colon 10/30/2022    Carpal tunnel syndrome, right 04/28/2021   Primary osteoarthritis of first carpometacarpal joint of left hand 04/28/2021   Abnormal glucose 04/17/2021   Vitamin D deficiency 04/17/2021   Benign neoplasm of ascending colon 08/18/2019   Major depression in partial remission (HCC) 02/09/2018   Nausea    Diarrhea    Gastritis without bleeding    Noninfectious diarrhea    Abnormal CT scan 09/23/2017   Depression 07/30/2016   Menopausal symptoms 06/10/2016   S/P trigger finger release 10/27/2013   Anxiety and depression 09/14/2013   Acquired hypothyroidism 09/14/2013   Hyperlipidemia, unspecified 09/14/2013   Palpitations 09/14/2013    Oletta Cohn, OTR/L,CLT 12/31/2022, 5:13 PM  [facility] [address] [place], [address] Phone: [phone]   Fax:  [phone]  Name: ANNICKA LAUKAITIS MRN: [id number] Date of Birth: January 26, 1966

## 2023-01-07 ENCOUNTER — Encounter: Payer: BC Managed Care – PPO | Admitting: Occupational Therapy

## 2023-01-14 ENCOUNTER — Ambulatory Visit: Payer: BC Managed Care – PPO | Admitting: Occupational Therapy

## 2023-01-18 ENCOUNTER — Encounter: Payer: BC Managed Care – PPO | Admitting: Occupational Therapy

## 2023-01-21 ENCOUNTER — Ambulatory Visit: Payer: BC Managed Care – PPO | Attending: Student | Admitting: Occupational Therapy

## 2023-01-21 DIAGNOSIS — M25642 Stiffness of left hand, not elsewhere classified: Secondary | ICD-10-CM | POA: Diagnosis present

## 2023-01-21 DIAGNOSIS — M25542 Pain in joints of left hand: Secondary | ICD-10-CM | POA: Insufficient documentation

## 2023-01-21 DIAGNOSIS — L905 Scar conditions and fibrosis of skin: Secondary | ICD-10-CM | POA: Insufficient documentation

## 2023-01-21 DIAGNOSIS — M6281 Muscle weakness (generalized): Secondary | ICD-10-CM | POA: Diagnosis present

## 2023-01-21 NOTE — Therapy (Signed)
[facility] [address] [place], [address] Phone: [phone]   Fax:  [phone]  Occupational Therapy Treatment  Patient Details  Name: Olivia Armstrong MRN: [id number] Date of Birth: 1966/04/17 Referring Provider (OT): Poggi   Encounter Date: 01/21/2023   OT End of Session - 01/21/23 1620     Visit Number 5    Number of Visits 12    Date for OT Re-Evaluation 01/25/23    OT Start Time 1620    OT Stop Time 1700    OT Time Calculation (min) 40 min    Activity Tolerance Patient tolerated treatment well    Behavior During Therapy [facility] for tasks assessed/performed             Past Medical History:  Diagnosis Date   Anxiety and depression 09/14/2013   Benign recurrent vertigo    pre - 2012.  none recently   Depression 07/30/2016   Dysrhythmia    palpitations.  no treatment. saw dr. Elijah Birk and had stress test but no diagnosis   GERD (gastroesophageal reflux disease)    winding down on antacids   Hyperlipidemia, unspecified 09/14/2013   Hyperthyroidism    Menopausal symptoms 06/10/2016   Overview:  prometrium 200mg  daily estrdiol patch 0.05mg /day q3days   Palpitations 09/14/2013   PONV (postoperative nausea and vomiting)    Pre-diabetes    S/P trigger finger release 10/27/2013   Vitamin D deficiency     Past Surgical History:  Procedure Laterality Date   CARPAL TUNNEL RELEASE Left 01/05/2018   Procedure: CARPAL TUNNEL RELEASE ENDOSCOPIC;  Surgeon: Christena Flake, MD;  Location: [facility];  Service: Orthopedics;  Laterality: Left;   CARPOMETACARPAL (CMC) FUSION OF THUMB Left 10/07/2022   Procedure: SUSPENSION ARTHROPLASTY OF LEFT THUMB CMC JOINT;  Surgeon: Christena Flake, MD;  Location: [facility] OR[facility];  Service: Orthopedics;  Laterality: Left;   CHOLECYSTECTOMY N/A 10/07/2018   Procedure: LAPAROSCOPIC CHOLECYSTECTOMY;  Surgeon: Carolan Shiver, MD;  Location: [facility] OR[facility];  Service: General;   Laterality: N/A;   COLONOSCOPY  09/2012   COLONOSCOPY WITH PROPOFOL N/A 10/15/2017   Procedure: COLONOSCOPY WITH PROPOFOL;  Surgeon: Midge Minium, MD;  Location: [facility];  Service: Endoscopy;  Laterality: N/A;   COLONOSCOPY WITH PROPOFOL N/A 10/30/2022   Procedure: COLONOSCOPY WITH PROPOFOL;  Surgeon: Midge Minium, MD;  Location: [facility];  Service: Endoscopy;  Laterality: N/A;   ESOPHAGOGASTRODUODENOSCOPY (EGD) WITH PROPOFOL N/A 10/15/2017   Procedure: ESOPHAGOGASTRODUODENOSCOPY (EGD) WITH PROPOFOL;  Surgeon: Midge Minium, MD;  Location: [facility];  Service: Endoscopy;  Laterality: N/A;   HYSTEROSCOPY WITH D & C N/A 02/05/2017   Procedure: DILATATION AND CURETTAGE /HYSTEROSCOPY;  Surgeon: Ward, Elenora Fender, MD;  Location: [facility] OR[facility];  Service: Gynecology;  Laterality: N/A;   POLYPECTOMY N/A 10/15/2017   Procedure: POLYPECTOMY;  Surgeon: Midge Minium, MD;  Location: [facility];  Service: Endoscopy;  Laterality: N/A;   POLYPECTOMY  10/30/2022   Procedure: POLYPECTOMY;  Surgeon: Midge Minium, MD;  Location: [facility];  Service: Endoscopy;;   TRIGGER FINGER RELEASE Left 2015   thumb, under local    There were no vitals filed for this visit.   Subjective Assessment - 01/21/23 1755     Subjective  I am doing most everything around the house and mowing the lawn.  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 Joice Lofts for suspension arthroplasty of left thumb  CMC joint.  Pt referred to OT for evaluation and treatment.    Patient Stated Goals Pt reports she would like to be able to use her hand without pain or issues, be independent with all tasks.    Currently in Pain? No/denies                 [facility] OT Assessment - 01/21/23 0001       AROM    Left Wrist Extension 75 Degrees    Left Wrist Flexion 70 Degrees      Strength   Right Hand Grip (lbs) 60    Right Hand Lateral Pinch 14 lbs    Right Hand 3 Point Pinch 15 lbs    Left Hand Grip (lbs) 58    Left Hand Lateral Pinch 11 lbs    Left Hand 3 Point Pinch 12 lbs      Left Hand AROM   L Thumb Radial ADduction/ABduction 0-55 55    L Thumb Palmar ADduction/ABduction 0-45 55    L Thumb Opposition to Index --   Opposition to base of 5th -strenght 5-/5                      OT Treatments/Exercises (OP) - 01/21/23 0001       LUE Fluidotherapy   Number Minutes Fluidotherapy 8 Minutes    LUE Fluidotherapy Location Wrist;Hand    Comments Decrease stiffness prior to review of home exercises             .  Reviewed with patient again about resting position as well as awareness of position of thumb when sitting or sleeping.   Therapeutic Exercises:   AROM palmar abduction, radial abduction of thumb   Opposition to each digit with full extension with each rep, pain free.   And can slide down 4th and if pain free- down 5th  Keeping CMC out of CT - doing great  After fluido - review rubber band for PA and RA 2 x 15 reps for 2-3 x day   If pain free can increase to 3rd set 2-3 x day Upgrade patient to medium firm putty for gripping , lat and 3 point pinch pain free 12 reps As well as pulling and twisting keeping thumb and opposition 12 reps  2 x day -increase to 2nd in 4 days if no increase symptoms and 3rd after another 4 days. Patient to keep it pain-free symptom-free     Cont with left med comfort cool splint for [facility] support  during any heavier daily tasks.   For functional strengthening            OT Education - 01/21/23 1756     Education Details progress and changes to HEP    Person(s) Educated Patient    Methods Explanation;Demonstration;Handout    Comprehension Verbalized understanding               OT Short Term Goals - 01/21/23  1800       OT SHORT TERM GOAL #1   Title Pt will be independent with use of contrast for edema control.    Status Achieved               OT Long Term Goals - 01/21/23 1800       OT  LONG TERM GOAL #1   Title Pt will demonstrate understanding of scar management techniques for left hand    Status Achieved      OT LONG TERM GOAL #2   Title Pt will demonstrate improved ROM of left hand to within normal limits to use for daily tasks without pain or limitations.    Status Achieved      OT LONG TERM GOAL #3   Title Pt will demonstrate abiity to perform opening of jars and containers without pain or difficulty.    Baseline Able to open jars with more ease - but still some discomfort    Time 1    Status On-going    Target Date 01/25/23      OT LONG TERM GOAL #4   Title Pt will demonstrate the ability to lift and carry 8# or greater object without difficulty or pain.    Status Achieved                   Plan - 01/21/23 1757     Clinical Impression Statement Pt is a 57 yo female referred to OT for evaluation and treatment following diagnosis of degenerative joint disease of her CMC left thumb and underwent surgical intervention for suspension arthroplasty of CMC left thumb joint on 10/07/2022.  She 14 weeks post surgery.  She denies pain this date.  Great progress in pain, scar tissue and AROM - strength increasing greatly - pt provided  this date with med firm putty for grip and prehension - as well as pulling and twisting- to increase endurance and cont with rubberband for thumb PA and RA .  Pt to follow up in 3 wks for possible discharge. Pt wants one more follow up - plan R [facility] next month.   She would benefit from skilled OT services to maximize safety and independence in necessary daily tasks.    OT Occupational Profile and History Detailed Assessment- Review of Records and additional review of physical, cognitive, psychosocial history related to current functional  performance    Occupational performance deficits (Please refer to evaluation for details): ADL's;Work;IADL's;Leisure    Body Structure / Function / Physical Skills ADL;Edema;Flexibility;ROM;UE functional use;FMC;Pain;Scar mobility;Strength;Coordination;Dexterity;IADL;Sensation    Psychosocial Skills Environmental  Adaptations;Habits;Routines and Behaviors    Rehab Potential Excellent    Clinical Decision Making Several treatment options, min-mod task modification necessary    Comorbidities Affecting Occupational Performance: May have comorbidities impacting occupational performance    Modification or Assistance to Complete Evaluation  No modification of tasks or assist necessary to complete eval    OT Frequency --   3 wks   OT Duration 4 weeks    OT Treatment/Interventions Self-care/ADL training;Fluidtherapy;Therapeutic exercise;Scar mobilization;Cryotherapy;Moist Heat;Ultrasound;Paraffin;Contrast Bath;Neuromuscular education;DME and/or AE instruction;Manual Therapy;Passive range of motion;Splinting;Patient/family education;Therapeutic activities    Consulted and Agree with Plan of Care Patient              Patient will benefit from skilled therapeutic intervention in order to improve the following deficits and impairments:   Body Structure / Function / Physical Skills: ADL, Edema, Flexibility, ROM, UE functional use, FMC, Pain, Scar mobility, Strength, Coordination, Dexterity, IADL, Sensation   Psychosocial Skills: Environmental  Adaptations, Habits, Routines and Behaviors   Visit Diagnosis: Muscle weakness (generalized)  Pain in joint of left hand  Scar condition and fibrosis of skin  Stiffness of left hand, not elsewhere classified    Problem List Patient Active Problem List   Diagnosis Date Noted   History of  colonic polyps 10/30/2022   Polyp of descending colon 10/30/2022   Carpal tunnel syndrome, right 04/28/2021   Primary osteoarthritis of first carpometacarpal joint  of left hand 04/28/2021   Abnormal glucose 04/17/2021   Vitamin D deficiency 04/17/2021   Benign neoplasm of ascending colon 08/18/2019   Major depression in partial remission (HCC) 02/09/2018   Nausea    Diarrhea    Gastritis without bleeding    Noninfectious diarrhea    Abnormal CT scan 09/23/2017   Depression 07/30/2016   Menopausal symptoms 06/10/2016   S/P trigger finger release 10/27/2013   Anxiety and depression 09/14/2013   Acquired hypothyroidism 09/14/2013   Hyperlipidemia, unspecified 09/14/2013   Palpitations 09/14/2013    Oletta Cohn, OTR/L,CLT 01/21/2023, 6:05 PM  [facility] [address], [place], [address] Phone: [phone]   Fax:  [phone]  Name: Olivia Armstrong MRN: [id number] Date of Birth: 23-Feb-1966

## 2023-01-26 ENCOUNTER — Other Ambulatory Visit: Payer: Self-pay | Admitting: Adult Health

## 2023-01-26 DIAGNOSIS — F324 Major depressive disorder, single episode, in partial remission: Secondary | ICD-10-CM

## 2023-01-28 ENCOUNTER — Telehealth: Payer: Self-pay | Admitting: Adult Health

## 2023-01-28 ENCOUNTER — Other Ambulatory Visit: Payer: Self-pay

## 2023-01-28 MED ORDER — DESVENLAFAXINE SUCCINATE ER 100 MG PO TB24: 100.0000 mg | ORAL_TABLET | Freq: Every day | ORAL | 0 refills | Status: DC

## 2023-01-28 NOTE — Telephone Encounter (Signed)
Next visit is 02/25/23. Olivia Armstrong's generic Pristiq 50 mg was cancelled as she is no longer taking it. She is requesting a new script. Pharmacy is:  [facility] - [place], [place] - [address]   Phone: [phone]  Fax: [phone]

## 2023-01-28 NOTE — Telephone Encounter (Signed)
pended

## 2023-02-08 ENCOUNTER — Encounter: Payer: BC Managed Care – PPO | Admitting: Occupational Therapy

## 2023-02-11 ENCOUNTER — Ambulatory Visit: Payer: BC Managed Care – PPO | Admitting: Occupational Therapy

## 2023-02-16 ENCOUNTER — Ambulatory Visit: Payer: BC Managed Care – PPO | Admitting: Occupational Therapy

## 2023-02-16 DIAGNOSIS — L905 Scar conditions and fibrosis of skin: Secondary | ICD-10-CM

## 2023-02-16 DIAGNOSIS — M6281 Muscle weakness (generalized): Secondary | ICD-10-CM

## 2023-02-16 DIAGNOSIS — M25542 Pain in joints of left hand: Secondary | ICD-10-CM

## 2023-02-16 DIAGNOSIS — M25642 Stiffness of left hand, not elsewhere classified: Secondary | ICD-10-CM

## 2023-02-16 NOTE — Therapy (Signed)
Plan - 02/16/23 1707     Clinical Impression Statement Pt is a 57 yo female referred to OT for evaluation and treatment following diagnosis of degenerative joint disease of her CMC left thumb and underwent surgical intervention for suspension arthroplasty of CMC left thumb joint on 10/07/2022.  She 4 1/s months post surgery.  She denies pain this date.  Great progress in pain, scar tissue and AROM  and strength since [facility]- pt return after doing HEP for 3 wks at home- pt - pt wanted one more follow up today - pt WNL for ROM - and strenght WFL - pt to cont for another 1-2 months 3 x wk strengthening - pt schedule for CTR on R hand late Nov- pt met all goals and discharge at this time .    OT Occupational Profile and History Detailed Assessment- Review of Records and additional review of physical, cognitive, psychosocial history related to current functional performance    Occupational performance deficits (Please refer to evaluation for details): ADL's;Work;IADL's;Leisure    Body Structure / Function / Physical Skills ADL;Edema;Flexibility;ROM;UE functional use;FMC;Pain;Scar mobility;Strength;Coordination;Dexterity;IADL;Sensation    Psychosocial Skills Environmental  Adaptations;Habits;Routines and Behaviors    Rehab Potential Excellent    Clinical Decision Making Several treatment options, min-mod task modification necessary    Comorbidities Affecting Occupational Performance: May have comorbidities impacting occupational performance    Modification or Assistance to Complete Evaluation  No modification of tasks or assist necessary to complete eval    OT Frequency 1x / week    OT Duration --   1 wks   OT Treatment/Interventions  Self-care/ADL training;Fluidtherapy;Therapeutic exercise;Scar mobilization;Cryotherapy;Moist Heat;Ultrasound;Paraffin;Contrast Bath;Neuromuscular education;DME and/or AE instruction;Manual Therapy;Passive range of motion;Splinting;Patient/family education;Therapeutic activities    Consulted and Agree with Plan of Care Patient             Patient will benefit from skilled therapeutic intervention in order to improve the following deficits and impairments:   Body Structure / Function / Physical Skills: ADL, Edema, Flexibility, ROM, UE functional use, FMC, Pain, Scar mobility, Strength, Coordination, Dexterity, IADL, Sensation   Psychosocial Skills: Environmental  Adaptations, Habits, Routines and Behaviors   Visit Diagnosis: Muscle weakness (generalized)  Pain in joint of left hand  Scar condition and fibrosis of skin  Stiffness of left hand, not elsewhere classified    Problem List Patient Active Problem List   Diagnosis Date Noted   History of colonic polyps 10/30/2022   Polyp of descending colon 10/30/2022   Carpal tunnel syndrome, right 04/28/2021   Primary osteoarthritis of first carpometacarpal joint of left hand 04/28/2021   Abnormal glucose 04/17/2021   Vitamin D deficiency 04/17/2021   Benign neoplasm of ascending colon 08/18/2019   Major depression in partial remission (HCC) 02/09/2018   Nausea    Diarrhea    Gastritis without bleeding    Noninfectious diarrhea    Abnormal CT scan 09/23/2017   Depression 07/30/2016   Menopausal symptoms 06/10/2016   S/P trigger finger release 10/27/2013   Anxiety and depression 09/14/2013   Acquired hypothyroidism 09/14/2013   Hyperlipidemia, unspecified 09/14/2013   Palpitations 09/14/2013    Oletta Cohn, OTR/L,CLT 02/16/2023, 5:12 PM  [facility] [address] [place], [address] Phone: [phone]   Fax:  [phone]  Name: Olivia Armstrong MRN:  [id number] Date of Birth: Jul 08, 1965  [facility] [address] [place], [address] Phone: [phone]   Fax:  [phone]  Occupational Therapy Treatment/discharge  Patient Details  Name: Olivia Armstrong MRN: [id number] Date of Birth: 01/14/1966 Referring Provider (OT): Poggi   Encounter Date: 02/16/2023   OT End of Session - 02/16/23 1707     Visit Number 6    Number of Visits 6    Date for OT Re-Evaluation 02/16/23    OT Start Time 1620    OT Stop Time 1644    OT Time Calculation (min) 24 min    Activity Tolerance Patient tolerated treatment well    Behavior During Therapy [facility] for tasks assessed/performed             Past Medical History:  Diagnosis Date   Anxiety and depression 09/14/2013   Benign recurrent vertigo    pre - 2012.  none recently   Depression 07/30/2016   Dysrhythmia    palpitations.  no treatment. saw dr. Elijah Birk and had stress test but no diagnosis   GERD (gastroesophageal reflux disease)    winding down on antacids   Hyperlipidemia, unspecified 09/14/2013   Hyperthyroidism    Menopausal symptoms 06/10/2016   Overview:  prometrium 200mg  daily estrdiol patch 0.05mg /day q3days   Palpitations 09/14/2013   PONV (postoperative nausea and vomiting)    Pre-diabetes    S/P trigger finger release 10/27/2013   Vitamin D deficiency     Past Surgical History:  Procedure Laterality Date   CARPAL TUNNEL RELEASE Left 01/05/2018   Procedure: CARPAL TUNNEL RELEASE ENDOSCOPIC;  Surgeon: Christena Flake, MD;  Location: [facility];  Service: Orthopedics;  Laterality: Left;   CARPOMETACARPAL (CMC) FUSION OF THUMB Left 10/07/2022   Procedure: SUSPENSION ARTHROPLASTY OF LEFT THUMB CMC JOINT;  Surgeon: Christena Flake, MD;  Location: [facility];  Service: Orthopedics;  Laterality: Left;   CHOLECYSTECTOMY N/A 10/07/2018   Procedure: LAPAROSCOPIC CHOLECYSTECTOMY;  Surgeon: Carolan Shiver, MD;  Location: [facility];  Service:  General;  Laterality: N/A;   COLONOSCOPY  09/2012   COLONOSCOPY WITH PROPOFOL N/A 10/15/2017   Procedure: COLONOSCOPY WITH PROPOFOL;  Surgeon: Midge Minium, MD;  Location: [facility];  Service: Endoscopy;  Laterality: N/A;   COLONOSCOPY WITH PROPOFOL N/A 10/30/2022   Procedure: COLONOSCOPY WITH PROPOFOL;  Surgeon: Midge Minium, MD;  Location: [facility];  Service: Endoscopy;  Laterality: N/A;   ESOPHAGOGASTRODUODENOSCOPY (EGD) WITH PROPOFOL N/A 10/15/2017   Procedure: ESOPHAGOGASTRODUODENOSCOPY (EGD) WITH PROPOFOL;  Surgeon: Midge Minium, MD;  Location: [facility];  Service: Endoscopy;  Laterality: N/A;   HYSTEROSCOPY WITH D & C N/A 02/05/2017   Procedure: DILATATION AND CURETTAGE /HYSTEROSCOPY;  Surgeon: Ward, Elenora Fender, MD;  Location: [facility];  Service: Gynecology;  Laterality: N/A;   POLYPECTOMY N/A 10/15/2017   Procedure: POLYPECTOMY;  Surgeon: Midge Minium, MD;  Location: [facility];  Service: Endoscopy;  Laterality: N/A;   POLYPECTOMY  10/30/2022   Procedure: POLYPECTOMY;  Surgeon: Midge Minium, MD;  Location: [facility];  Service: Endoscopy;;   TRIGGER FINGER RELEASE Left 2015   thumb, under local    There were no vitals filed for this visit.   Subjective Assessment - 02/16/23 1704     Subjective  I am doing great.  Thumb doing great wrist doing good using it normally.  Now having my right carpal tunnel done by Dr. Joice Lofts later Nov    Pertinent History Pt diagnosed with Degenerative joint  [facility] [address] [place], [address] Phone: [phone]   Fax:  [phone]  Occupational Therapy Treatment/discharge  Patient Details  Name: Olivia Armstrong MRN: [id number] Date of Birth: 01/14/1966 Referring Provider (OT): Poggi   Encounter Date: 02/16/2023   OT End of Session - 02/16/23 1707     Visit Number 6    Number of Visits 6    Date for OT Re-Evaluation 02/16/23    OT Start Time 1620    OT Stop Time 1644    OT Time Calculation (min) 24 min    Activity Tolerance Patient tolerated treatment well    Behavior During Therapy [facility] for tasks assessed/performed             Past Medical History:  Diagnosis Date   Anxiety and depression 09/14/2013   Benign recurrent vertigo    pre - 2012.  none recently   Depression 07/30/2016   Dysrhythmia    palpitations.  no treatment. saw dr. Elijah Birk and had stress test but no diagnosis   GERD (gastroesophageal reflux disease)    winding down on antacids   Hyperlipidemia, unspecified 09/14/2013   Hyperthyroidism    Menopausal symptoms 06/10/2016   Overview:  prometrium 200mg  daily estrdiol patch 0.05mg /day q3days   Palpitations 09/14/2013   PONV (postoperative nausea and vomiting)    Pre-diabetes    S/P trigger finger release 10/27/2013   Vitamin D deficiency     Past Surgical History:  Procedure Laterality Date   CARPAL TUNNEL RELEASE Left 01/05/2018   Procedure: CARPAL TUNNEL RELEASE ENDOSCOPIC;  Surgeon: Christena Flake, MD;  Location: [facility];  Service: Orthopedics;  Laterality: Left;   CARPOMETACARPAL (CMC) FUSION OF THUMB Left 10/07/2022   Procedure: SUSPENSION ARTHROPLASTY OF LEFT THUMB CMC JOINT;  Surgeon: Christena Flake, MD;  Location: [facility];  Service: Orthopedics;  Laterality: Left;   CHOLECYSTECTOMY N/A 10/07/2018   Procedure: LAPAROSCOPIC CHOLECYSTECTOMY;  Surgeon: Carolan Shiver, MD;  Location: [facility];  Service:  General;  Laterality: N/A;   COLONOSCOPY  09/2012   COLONOSCOPY WITH PROPOFOL N/A 10/15/2017   Procedure: COLONOSCOPY WITH PROPOFOL;  Surgeon: Midge Minium, MD;  Location: [facility];  Service: Endoscopy;  Laterality: N/A;   COLONOSCOPY WITH PROPOFOL N/A 10/30/2022   Procedure: COLONOSCOPY WITH PROPOFOL;  Surgeon: Midge Minium, MD;  Location: [facility];  Service: Endoscopy;  Laterality: N/A;   ESOPHAGOGASTRODUODENOSCOPY (EGD) WITH PROPOFOL N/A 10/15/2017   Procedure: ESOPHAGOGASTRODUODENOSCOPY (EGD) WITH PROPOFOL;  Surgeon: Midge Minium, MD;  Location: [facility];  Service: Endoscopy;  Laterality: N/A;   HYSTEROSCOPY WITH D & C N/A 02/05/2017   Procedure: DILATATION AND CURETTAGE /HYSTEROSCOPY;  Surgeon: Ward, Elenora Fender, MD;  Location: [facility];  Service: Gynecology;  Laterality: N/A;   POLYPECTOMY N/A 10/15/2017   Procedure: POLYPECTOMY;  Surgeon: Midge Minium, MD;  Location: [facility];  Service: Endoscopy;  Laterality: N/A;   POLYPECTOMY  10/30/2022   Procedure: POLYPECTOMY;  Surgeon: Midge Minium, MD;  Location: [facility];  Service: Endoscopy;;   TRIGGER FINGER RELEASE Left 2015   thumb, under local    There were no vitals filed for this visit.   Subjective Assessment - 02/16/23 1704     Subjective  I am doing great.  Thumb doing great wrist doing good using it normally.  Now having my right carpal tunnel done by Dr. Joice Lofts later Nov    Pertinent History Pt diagnosed with Degenerative joint

## 2023-02-24 ENCOUNTER — Ambulatory Visit: Payer: BC Managed Care – PPO | Admitting: Adult Health

## 2023-02-25 ENCOUNTER — Encounter: Payer: Self-pay | Admitting: Adult Health

## 2023-02-25 ENCOUNTER — Ambulatory Visit: Payer: BC Managed Care – PPO | Admitting: Adult Health

## 2023-02-25 DIAGNOSIS — G4700 Insomnia, unspecified: Secondary | ICD-10-CM

## 2023-02-25 DIAGNOSIS — F428 Other obsessive-compulsive disorder: Secondary | ICD-10-CM | POA: Diagnosis not present

## 2023-02-25 DIAGNOSIS — F324 Major depressive disorder, single episode, in partial remission: Secondary | ICD-10-CM

## 2023-02-25 DIAGNOSIS — F411 Generalized anxiety disorder: Secondary | ICD-10-CM

## 2023-02-25 NOTE — Progress Notes (Signed)
[name] [id number] Apr 22, 1965 57 y.o.  Subjective:   Patient ID:  Olivia Armstrong is a 57 y.o. (DOB 1966-02-15) female.  Chief Complaint: No chief complaint on file.   HPI Olivia Armstrong presents to the office today for follow-up of MDD, GAD, obsessional thoughts and acts, and insomnia.  Describes mood today as "ok". Pleasant. Denies tearfulness. Mood symptoms - reports decreased depression, anxiety and irritability. Denies panic attacks. Decreased irrational thinking. Denies worry, rumination, and over thinking. Denies obsessive thoughts. Mood has improved. Stating "I feel like I'm doing better". Feels like the increase in Pristiq has been helpful. Stable interest and motivation. Taking medications as prescribed. Energy levels better. Active, does not have a regular exercise routine. Enjoys some usual interests and activities. Divorced. Single, not dating. Lives with cat "Eulah Pont" - 17. Sisters in [place] and [place]. Spending time with family. Appetite adequate. Weight loss - 155 from 160 pounds. Sleeps well most nights. Averages 7 hours. Focus and concentration improved. Completing tasks. Managing aspects of household. 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 Denies SI or HI.  Denies AH or VH. Denies self harm. Substance use - THC daily.  Previous medication trials: Lexapro, Ambien, Prozac, Trazadone, Zoloft   Flowsheet Row Admission (Discharged) from 10/30/2022 in [facility] PERIOP Admission (Discharged) from 10/07/2022 in [facility] PERIOPER[facility] AREA Pre-Admission Testing 45 from 09/29/2022 in [facility] PRE ADMISSION TESTING  C-SSRS RISK CATEGORY No Risk No Risk No Risk        Review of Systems:  Review of Systems  Musculoskeletal:  Negative for gait problem.  Neurological:  Negative for tremors.  Psychiatric/Behavioral:         Please refer to HPI    Medications: I have  reviewed the patient's current medications.  Current Outpatient Medications  Medication Sig Dispense Refill   Brimonidine Tartrate (LUMIFY) 0.025 % SOLN Place 1 drop into both eyes daily as needed (Itchy eyes).     desvenlafaxine (PRISTIQ) 100 MG 24 hr tablet Take 1 tablet (100 mg total) by mouth daily. 90 tablet 0   estradiol (VIVELLE-DOT) 0.05 MG/24HR patch Place 1 patch onto the skin 2 (two) times a week. Change patch on Wed and Sun.     hydrocortisone 2.5 % ointment Apply 1 application topically 2 (two) times daily as needed (itching).      ibuprofen (ADVIL,MOTRIN) 200 MG tablet Take 600 mg by mouth every 6 (six) hours as needed for fever, headache or mild pain.      progesterone (PROMETRIUM) 200 MG capsule Take 200 mg by mouth at bedtime.      thyroid (ARMOUR THYROID) 30 MG tablet Take 30 mg by mouth daily before breakfast.      traMADol (ULTRAM) 50 MG tablet Take 1 tablet (50 mg total) by mouth every 6 (six) hours as needed. (Patient not taking: Reported on 10/26/2022) 20 tablet 0   tretinoin (RETIN-A) 0.05 % cream Apply 1 application topically at bedtime as needed (for skin care).     Vitamin D, Ergocalciferol, (DRISDOL) 50000 units CAPS capsule Take 50,000 Units by mouth See admin instructions. Take 1 capsule twice a week on Tuesdays and Saturdays.     zolpidem (AMBIEN) 5 MG tablet Take 5 mg by mouth at bedtime.     No current facility-administered medications for this visit.    Medication Side Effects: None  Allergies:  Allergies  Allergen Reactions   Kiwi Extract Anaphylaxis  Itchy throat and tongue Mouth and throat swelling and itching   Mirtazapine Other (See Comments)    Dizziness    Past Medical History:  Diagnosis Date   Anxiety and depression 09/14/2013   Benign recurrent vertigo    pre - 2012.  none recently   Depression 07/30/2016   Dysrhythmia    palpitations.  no treatment. saw dr. Elijah Birk and had stress test but no diagnosis   GERD (gastroesophageal reflux  disease)    winding down on antacids   Hyperlipidemia, unspecified 09/14/2013   Hyperthyroidism    Menopausal symptoms 06/10/2016   Overview:  prometrium 200mg  daily estrdiol patch 0.05mg /day q3days   Palpitations 09/14/2013   PONV (postoperative nausea and vomiting)    Pre-diabetes    S/P trigger finger release 10/27/2013   Vitamin D deficiency     Past Medical History, Surgical history, Social history, and Family history were reviewed and updated as appropriate.   Please see review of systems for further details on the patient's review from today.   Objective:   Physical Exam:  There were no vitals taken for this visit.  Physical Exam Constitutional:      General: She is not in acute distress. Musculoskeletal:        General: No deformity.  Neurological:     Mental Status: She is alert and oriented to person, place, and time.     Coordination: Coordination normal.  Psychiatric:        Attention and Perception: Attention and perception normal. She does not perceive auditory or visual hallucinations.        Mood and Affect: Mood normal. Mood is not anxious or depressed. Affect is not labile, blunt, angry or inappropriate.        Speech: Speech normal.        Behavior: Behavior normal.        Thought Content: Thought content normal. Thought content is not paranoid or delusional. Thought content does not include homicidal or suicidal ideation. Thought content does not include homicidal or suicidal plan.        Cognition and Memory: Cognition and memory normal.        Judgment: Judgment normal.     Comments: Insight intact     Lab Review:     Component Value Date/Time   NA 139 10/02/2022 1400   K 4.1 10/02/2022 1400   CL 105 10/02/2022 1400   CO2 25 10/02/2022 1400   GLUCOSE 113 (H) 10/02/2022 1400   BUN 20 10/02/2022 1400   CREATININE 0.77 10/02/2022 1400   CALCIUM 9.2 10/02/2022 1400   GFRNONAA >60 10/02/2022 1400   GFRAA >60 01/26/2017 1218       Component  Value Date/Time   WBC 8.0 10/02/2022 1400   RBC 4.52 10/02/2022 1400   HGB 13.9 10/02/2022 1400   HCT 41.0 10/02/2022 1400   PLT 269 10/02/2022 1400   MCV 90.7 10/02/2022 1400   MCH 30.8 10/02/2022 1400   MCHC 33.9 10/02/2022 1400   RDW 12.9 10/02/2022 1400    No results found for: "POCLITH", "LITHIUM"   No results found for: "PHENYTOIN", "PHENOBARB", "VALPROATE", "CBMZ"   .res Assessment: Plan:    Plan:  PDMP reviewed  Pristiq 100mg  every morning   Ambien 5mg  at hs for sleep  Genesight testing reviewed.   RTC 3 months  Patient advised to contact office with any questions, adverse effects, or acute worsening in signs and symptoms.  There are no diagnoses linked to this encounter.  Please see After Visit Summary for patient specific instructions.  Future Appointments  Date Time Provider Department Center  02/25/2023  5:00 PM Allysia Ingles, Thereasa Solo, NP [facility] None  03/30/2023  4:00 PM Waldron Session, [facility] None  04/22/2023  4:00 PM Waldron Session, [facility] None    No orders of the defined types were placed in this encounter.   -------------------------------

## 2023-02-26 ENCOUNTER — Other Ambulatory Visit: Payer: Self-pay | Admitting: Surgery

## 2023-03-02 ENCOUNTER — Other Ambulatory Visit: Payer: Self-pay

## 2023-03-02 ENCOUNTER — Encounter
Admission: RE | Admit: 2023-03-02 | Discharge: 2023-03-02 | Disposition: A | Discharge status: Home / Self Care | Payer: BC Managed Care – PPO | Source: Ambulatory Visit | Attending: Surgery | Admitting: Surgery

## 2023-03-02 NOTE — Patient Instructions (Addendum)
Your procedure is scheduled on: Wednesday 03/10/23 To find out your arrival time, please call [phone] between 1PM - 3PM on:   Tuesday 03/09/23 Report to the Registration Desk on the 1st floor of the [facility].  Free Valet parking is available.  If your arrival time is 6:00 am, do not arrive before that time as the [facility] entrance doors do not open until 6:00 am.  REMEMBER: Instructions that are not followed completely may result in serious medical risk, up to and including death; or upon the discretion of your surgeon and anesthesiologist your surgery may need to be rescheduled.  Do not eat food after midnight the night before surgery.  No gum chewing or hard candies.  You may however, drink CLEAR liquids up to 2 hours before you are scheduled to arrive for your surgery. Do not drink anything within 2 hours of your scheduled arrival time.  Clear liquids include: - water  - apple juice without pulp - gatorade (not RED colors) - black coffee or tea (Do NOT add milk or creamers to the coffee or tea) Do NOT drink anything that is not on this list.  Type 1 and Type 2 diabetics should only drink water.  In addition, your doctor has ordered for you to drink the provided:  Ensure Pre-Surgery Clear Carbohydrate Drink (if unable to tolerate drink water) Drinking this carbohydrate drink up to two hours before surgery helps to reduce insulin resistance and improve patient outcomes. Please complete drinking 2 hours before scheduled arrival time.  One week prior to surgery: Stop Anti-inflammatories (NSAIDS) such as Advil, Aleve, Ibuprofen, Motrin, Naproxen, Naprosyn and Aspirin based products such as Excedrin, Goody's Powder, BC Powder. You may however, continue to take Tylenol if needed for pain up until the day of surgery.  Stop ANY OVER THE COUNTER supplements and vitamins until after surgery.  Continue taking all prescribed medications.   TAKE ONLY THESE MEDICATIONS THE  MORNING OF SURGERY WITH A SIP OF WATER:  desvenlafaxine (PRISTIQ) 100 MG 24 hr tablet  thyroid (ARMOUR THYROID) 30 MG tablet   No Alcohol for 24 hours before or after surgery.  No Smoking including e-cigarettes for 24 hours before surgery.  No chewable tobacco products for at least 6 hours before surgery.  No nicotine patches on the day of surgery.  Do not use any "recreational" drugs for at least a week (preferably 2 weeks) before your surgery.  Please be advised that the combination of cocaine and anesthesia may have negative outcomes, up to and including death. If you test positive for cocaine, your surgery will be cancelled.  On the morning of surgery brush your teeth with toothpaste and water, you may rinse your mouth with mouthwash if you wish. Do not swallow any toothpaste or mouthwash.  Use CHG Soap or wipes as directed on instruction sheet.  Do not wear lotions, powders, or perfumes.   Do not shave body hair from the neck down 48 hours before surgery.  Wear comfortable clothing (specific to your surgery type) to the hospital.  Do not wear jewelry, make-up, hairpins, clips or nail polish.  For welded (permanent) jewelry: bracelets, anklets, waist bands, etc.  Please have this removed prior to surgery.  If it is not removed, there is a chance that hospital personnel will need to cut it off on the day of surgery. Contact lenses, hearing aids and dentures may not be worn into surgery.  Do not bring valuables to the hospital. [facility] is  not responsible for any missing/lost belongings or valuables.   Notify your doctor if there is any change in your medical condition (cold, fever, infection).  If you are being discharged the day of surgery, you will not be allowed to drive home. You will need a responsible individual to drive you home and stay with you for 24 hours after surgery.   If you are taking public transportation, you will need to have a responsible individual  with you.  If you are being admitted to the hospital overnight, leave your suitcase in the car. After surgery it may be brought to your room.  In case of increased patient census, it may be necessary for you, the patient, to continue your postoperative care in the Same Day Surgery department.  After surgery, you can help prevent lung complications by doing breathing exercises.  Take deep breaths and cough every 1-2 hours. Your doctor may order a device called an Incentive Spirometer to help you take deep breaths. When coughing or sneezing, hold a pillow firmly against your incision with both hands. This is called "splinting." Doing this helps protect your incision. It also decreases belly discomfort.  Surgery Visitation Policy:  Patients undergoing a surgery or procedure may have two family members or support persons with them as long as the person is not COVID-19 positive or experiencing its symptoms.   Inpatient Visitation:    Visiting hours are 7 a.m. to 8 p.m. Up to four visitors are allowed at one time in a patient room. The visitors may rotate out with other people during the day. One designated support person (adult) may remain overnight.  Please call the Pre-admissions Testing Dept. at [phone] if you have any questions about these instructions.     Preparing for Surgery with CHLORHEXIDINE GLUCONATE (CHG) Soap  Chlorhexidine Gluconate (CHG) Soap  o An antiseptic cleaner that kills germs and bonds with the skin to continue killing germs even after washing  o Used for showering the night before surgery and morning of surgery  Before surgery, you can play an important role by reducing the number of germs on your skin.  CHG (Chlorhexidine gluconate) soap is an antiseptic cleanser which kills germs and bonds with the skin to continue killing germs even after washing.  Please do not use if you have an allergy to CHG or antibacterial soaps. If your skin becomes  reddened/irritated stop using the CHG.  1. Shower the NIGHT BEFORE SURGERY and the MORNING OF SURGERY with CHG soap.  2. If you choose to wash your hair, wash your hair first as usual with your normal shampoo.  3. After shampooing, rinse your hair and body thoroughly to remove the shampoo.  4. Use CHG as you would any other liquid soap. You can apply CHG directly to the skin and wash gently with a scrungie or a clean washcloth.  5. Apply the CHG soap to your body only from the neck down. Do not use on open wounds or open sores. Avoid contact with your eyes, ears, mouth, and genitals (private parts). Wash face and genitals (private parts) with your normal soap.  6. Wash thoroughly, paying special attention to the area where your surgery will be performed.  7. Thoroughly rinse your body with warm water.  8. Do not shower/wash with your normal soap after using and rinsing off the CHG soap.  9. Pat yourself dry with a clean towel.  10. Wear clean pajamas to bed the night before surgery.  12.  Place clean sheets on your bed the night of your first shower and do not sleep with pets.  13. Shower again with the CHG soap on the day of surgery prior to arriving at the hospital.  14. Do not apply any deodorants/lotions/powders.  15. Please wear clean clothes to the hospital.

## 2023-03-09 MED ORDER — PROPOFOL 1000 MG/100ML IV EMUL
INTRAVENOUS | Status: AC
  Filled 2023-03-09: qty 200

## 2023-03-09 MED ORDER — PROPOFOL 1000 MG/100ML IV EMUL
INTRAVENOUS | Status: AC
  Filled 2023-03-09: qty 100

## 2023-03-09 MED ORDER — LACTATED RINGERS IV SOLN
INTRAVENOUS | Status: DC
Start: 2023-03-09 — End: 2023-03-10

## 2023-03-09 MED ORDER — CHLORHEXIDINE GLUCONATE 0.12 % MT SOLN
15.0000 mL | Freq: Once | OROMUCOSAL | Status: AC
  Administered 2023-03-10: 15 mL via OROMUCOSAL

## 2023-03-09 MED ORDER — ORAL CARE MOUTH RINSE: 15.0000 mL | Freq: Once | OROMUCOSAL | Status: AC

## 2023-03-09 MED ORDER — CEFAZOLIN SODIUM-DEXTROSE 2-4 GM/100ML-% IV SOLN
2.0000 g | INTRAVENOUS | Status: AC
  Administered 2023-03-10: 2 g via INTRAVENOUS

## 2023-03-10 ENCOUNTER — Other Ambulatory Visit: Payer: Self-pay

## 2023-03-10 ENCOUNTER — Ambulatory Visit
Admission: RE | Admit: 2023-03-10 | Discharge: 2023-03-10 | Disposition: A | Discharge status: Home / Self Care | Payer: BC Managed Care – PPO | Attending: Surgery | Admitting: Surgery

## 2023-03-10 ENCOUNTER — Encounter: Payer: Self-pay | Admitting: Surgery

## 2023-03-10 ENCOUNTER — Ambulatory Visit: Payer: BC Managed Care – PPO

## 2023-03-10 ENCOUNTER — Encounter
Admission: RE | Disposition: A | Discharge status: Home / Self Care | Payer: Self-pay | Source: Home / Self Care | Attending: Surgery

## 2023-03-10 DIAGNOSIS — F32A Depression, unspecified: Secondary | ICD-10-CM | POA: Diagnosis not present

## 2023-03-10 DIAGNOSIS — E039 Hypothyroidism, unspecified: Secondary | ICD-10-CM | POA: Insufficient documentation

## 2023-03-10 DIAGNOSIS — G5601 Carpal tunnel syndrome, right upper limb: Secondary | ICD-10-CM | POA: Insufficient documentation

## 2023-03-10 DIAGNOSIS — F419 Anxiety disorder, unspecified: Secondary | ICD-10-CM | POA: Insufficient documentation

## 2023-03-10 DIAGNOSIS — K219 Gastro-esophageal reflux disease without esophagitis: Secondary | ICD-10-CM | POA: Diagnosis not present

## 2023-03-10 HISTORY — PX: CARPAL TUNNEL RELEASE: SHX101

## 2023-03-10 SURGERY — RELEASE, CARPAL TUNNEL, ENDOSCOPIC
Anesthesia: General | Site: Wrist | Laterality: Right

## 2023-03-10 MED ORDER — SUCCINYLCHOLINE CHLORIDE 200 MG/10ML IV SOSY
PREFILLED_SYRINGE | INTRAVENOUS | Status: DC | PRN
  Administered 2023-03-10: 30 mg via INTRAVENOUS

## 2023-03-10 MED ORDER — METOCLOPRAMIDE HCL 10 MG PO TABS: 5.0000 mg | ORAL_TABLET | Freq: Three times a day (TID) | ORAL | Status: DC | PRN

## 2023-03-10 MED ORDER — KETOROLAC TROMETHAMINE 30 MG/ML IJ SOLN
30.0000 mg | Freq: Once | INTRAMUSCULAR | Status: AC
  Administered 2023-03-10: 30 mg via INTRAVENOUS

## 2023-03-10 MED ORDER — ONDANSETRON HCL 4 MG/2ML IJ SOLN
INTRAMUSCULAR | Status: DC | PRN
  Administered 2023-03-10: 4 mg via INTRAVENOUS

## 2023-03-10 MED ORDER — OXYCODONE HCL 5 MG PO TABS: 5.0000 mg | ORAL_TABLET | Freq: Once | ORAL | Status: DC | PRN

## 2023-03-10 MED ORDER — PHENYLEPHRINE 80 MCG/ML (10ML) SYRINGE FOR IV PUSH (FOR BLOOD PRESSURE SUPPORT)
PREFILLED_SYRINGE | INTRAVENOUS | Status: AC
  Filled 2023-03-10: qty 10

## 2023-03-10 MED ORDER — ONDANSETRON HCL 4 MG PO TABS: 4.0000 mg | ORAL_TABLET | Freq: Four times a day (QID) | ORAL | Status: DC | PRN

## 2023-03-10 MED ORDER — ACETAMINOPHEN 325 MG PO TABS: 325.0000 mg | ORAL_TABLET | Freq: Four times a day (QID) | ORAL | Status: DC | PRN

## 2023-03-10 MED ORDER — BUPIVACAINE HCL (PF) 0.5 % IJ SOLN
INTRAMUSCULAR | Status: AC
  Filled 2023-03-10: qty 30

## 2023-03-10 MED ORDER — MIDAZOLAM HCL 2 MG/2ML IJ SOLN
INTRAMUSCULAR | Status: DC | PRN
  Administered 2023-03-10: 2 mg via INTRAVENOUS

## 2023-03-10 MED ORDER — BUPIVACAINE HCL (PF) 0.5 % IJ SOLN
INTRAMUSCULAR | Status: DC | PRN
  Administered 2023-03-10: 10 mL

## 2023-03-10 MED ORDER — ACETAMINOPHEN 10 MG/ML IV SOLN: 1000.0000 mg | Freq: Once | INTRAVENOUS | Status: DC | PRN

## 2023-03-10 MED ORDER — 0.9 % SODIUM CHLORIDE (POUR BTL) OPTIME
TOPICAL | Status: DC | PRN
  Administered 2023-03-10: 500 mL

## 2023-03-10 MED ORDER — ONDANSETRON HCL 4 MG/2ML IJ SOLN
INTRAMUSCULAR | Status: AC
  Filled 2023-03-10: qty 2

## 2023-03-10 MED ORDER — MIDAZOLAM HCL 2 MG/2ML IJ SOLN
INTRAMUSCULAR | Status: AC
  Filled 2023-03-10: qty 2

## 2023-03-10 MED ORDER — ONDANSETRON HCL 4 MG/2ML IJ SOLN
4.0000 mg | Freq: Four times a day (QID) | INTRAMUSCULAR | Status: DC | PRN
Start: 2023-03-10 — End: 2023-03-10

## 2023-03-10 MED ORDER — METOCLOPRAMIDE HCL 5 MG/ML IJ SOLN: 5.0000 mg | Freq: Three times a day (TID) | INTRAMUSCULAR | Status: DC | PRN

## 2023-03-10 MED ORDER — DEXAMETHASONE SODIUM PHOSPHATE 10 MG/ML IJ SOLN
INTRAMUSCULAR | Status: DC | PRN
  Administered 2023-03-10: 10 mg via INTRAVENOUS

## 2023-03-10 MED ORDER — ACETAMINOPHEN 10 MG/ML IV SOLN
INTRAVENOUS | Status: DC | PRN
  Administered 2023-03-10: 1000 mg via INTRAVENOUS

## 2023-03-10 MED ORDER — CHLORHEXIDINE GLUCONATE 0.12 % MT SOLN
OROMUCOSAL | Status: AC
  Filled 2023-03-10: qty 15

## 2023-03-10 MED ORDER — CEFAZOLIN SODIUM-DEXTROSE 2-4 GM/100ML-% IV SOLN
INTRAVENOUS | Status: AC
  Filled 2023-03-10: qty 100

## 2023-03-10 MED ORDER — PROPOFOL 10 MG/ML IV BOLUS
INTRAVENOUS | Status: DC | PRN
  Administered 2023-03-10: 50 mg via INTRAVENOUS
  Administered 2023-03-10: 100 ug/kg/min via INTRAVENOUS
  Administered 2023-03-10: 80 mg via INTRAVENOUS
  Administered 2023-03-10: 150 mg via INTRAVENOUS

## 2023-03-10 MED ORDER — FENTANYL CITRATE (PF) 100 MCG/2ML IJ SOLN
INTRAMUSCULAR | Status: DC | PRN
  Administered 2023-03-10 (×2): 50 ug via INTRAVENOUS

## 2023-03-10 MED ORDER — PHENYLEPHRINE 80 MCG/ML (10ML) SYRINGE FOR IV PUSH (FOR BLOOD PRESSURE SUPPORT)
PREFILLED_SYRINGE | INTRAVENOUS | Status: DC | PRN
  Administered 2023-03-10 (×2): 80 ug via INTRAVENOUS

## 2023-03-10 MED ORDER — ACETAMINOPHEN 10 MG/ML IV SOLN
INTRAVENOUS | Status: AC
  Filled 2023-03-10: qty 100

## 2023-03-10 MED ORDER — DROPERIDOL 2.5 MG/ML IJ SOLN: 0.6250 mg | Freq: Once | INTRAMUSCULAR | Status: DC | PRN

## 2023-03-10 MED ORDER — OXYCODONE HCL 5 MG/5ML PO SOLN: 5.0000 mg | Freq: Once | ORAL | Status: DC | PRN

## 2023-03-10 MED ORDER — DEXTROSE-SODIUM CHLORIDE 5-0.9 % IV SOLN: INTRAVENOUS | Status: DC

## 2023-03-10 MED ORDER — TRAMADOL HCL 50 MG PO TABS: 50.0000 mg | ORAL_TABLET | Freq: Four times a day (QID) | ORAL | Status: DC | PRN

## 2023-03-10 MED ORDER — FENTANYL CITRATE (PF) 100 MCG/2ML IJ SOLN
INTRAMUSCULAR | Status: AC
  Filled 2023-03-10: qty 2

## 2023-03-10 MED ORDER — FENTANYL CITRATE (PF) 100 MCG/2ML IJ SOLN: 25.0000 ug | INTRAMUSCULAR | Status: DC | PRN

## 2023-03-10 MED ORDER — GLYCOPYRROLATE 0.2 MG/ML IJ SOLN
INTRAMUSCULAR | Status: AC
  Filled 2023-03-10: qty 1

## 2023-03-10 MED ORDER — GLYCOPYRROLATE 0.2 MG/ML IJ SOLN
INTRAMUSCULAR | Status: DC | PRN
  Administered 2023-03-10: .2 mg via INTRAVENOUS

## 2023-03-10 MED ORDER — DEXAMETHASONE SODIUM PHOSPHATE 10 MG/ML IJ SOLN
INTRAMUSCULAR | Status: AC
Start: 2023-03-10 — End: ?
  Filled 2023-03-10: qty 1

## 2023-03-10 MED ORDER — KETOROLAC TROMETHAMINE 30 MG/ML IJ SOLN
INTRAMUSCULAR | Status: AC
  Filled 2023-03-10: qty 1

## 2023-03-10 SURGICAL SUPPLY — 29 items
BNDG COHESIVE 4X5 TAN STRL LF (GAUZE/BANDAGES/DRESSINGS) ×1 IMPLANT
BNDG ELASTIC 2INX 5YD STR LF (GAUZE/BANDAGES/DRESSINGS) ×1 IMPLANT
BNDG ESMARCH 4X12 STRL LF (GAUZE/BANDAGES/DRESSINGS) ×1 IMPLANT
CHLORAPREP W/TINT 26 (MISCELLANEOUS) ×1 IMPLANT
CORD BIP STRL DISP 12FT (MISCELLANEOUS) ×1 IMPLANT
CUFF TOURN SGL QUICK 18X4 (TOURNIQUET CUFF) ×1 IMPLANT
DRAPE SURG 17X11 SM STRL (DRAPES) ×1 IMPLANT
FORCEPS JEWEL BIP 4-3/4 STR (INSTRUMENTS) ×1 IMPLANT
GAUZE SPONGE 4X4 12PLY STRL (GAUZE/BANDAGES/DRESSINGS) ×1 IMPLANT
GAUZE XEROFORM 1X8 LF (GAUZE/BANDAGES/DRESSINGS) ×1 IMPLANT
GLOVE BIO SURGEON STRL SZ8 (GLOVE) ×1 IMPLANT
GLOVE INDICATOR 8.0 STRL GRN (GLOVE) ×1 IMPLANT
GOWN STRL REUS W/ TWL LRG LVL3 (GOWN DISPOSABLE) ×1 IMPLANT
GOWN STRL REUS W/ TWL XL LVL3 (GOWN DISPOSABLE) ×1 IMPLANT
KIT ESCP INSRT D SLOT CANN KN (MISCELLANEOUS) ×1 IMPLANT
KIT TURNOVER KIT A (KITS) ×1 IMPLANT
MANIFOLD NEPTUNE II (INSTRUMENTS) ×1 IMPLANT
NS IRRIG 500ML POUR BTL (IV SOLUTION) ×1 IMPLANT
PACK EXTREMITY ARMC (MISCELLANEOUS) ×1 IMPLANT
SPLINT WRIST LG LT TX990309 (SOFTGOODS) IMPLANT
SPLINT WRIST LG RT TX900304 (SOFTGOODS) IMPLANT
SPLINT WRIST M LT TX990308 (SOFTGOODS) IMPLANT
SPLINT WRIST M RT TX990303 (SOFTGOODS) IMPLANT
SPLINT WRIST XL LT TX990310 (SOFTGOODS) IMPLANT
SPLINT WRIST XL RT TX990305 (SOFTGOODS) IMPLANT
STOCKINETTE IMPERVIOUS 9X36 MD (GAUZE/BANDAGES/DRESSINGS) ×1 IMPLANT
SUT PROLENE 4 0 PS 2 18 (SUTURE) ×1 IMPLANT
TRAP FLUID SMOKE EVACUATOR (MISCELLANEOUS) ×1 IMPLANT
WATER STERILE IRR 500ML POUR (IV SOLUTION) ×1 IMPLANT

## 2023-03-10 NOTE — Anesthesia Preprocedure Evaluation (Signed)
Anesthesia Evaluation  Patient identified by MRN, date of birth, ID band Patient awake    Reviewed: Allergy & Precautions, H&P , NPO status , Patient's Chart, lab work & pertinent test results, reviewed documented beta blocker date and time   History of Anesthesia Complications (+) PONV and history of anesthetic complications  Airway Mallampati: II  TM Distance: >3 FB Neck ROM: full    Dental  (+) Teeth Intact   Pulmonary neg pulmonary ROS   Pulmonary exam normal        Cardiovascular Exercise Tolerance: Good Normal cardiovascular exam+ dysrhythmias  Rate:Normal     Neuro/Psych  PSYCHIATRIC DISORDERS Anxiety Depression     Neuromuscular disease    GI/Hepatic Neg liver ROS,GERD  Medicated,,  Endo/Other  diabetesHypothyroidism Hyperthyroidism   Renal/GU negative Renal ROS  negative genitourinary   Musculoskeletal   Abdominal   Peds  Hematology negative hematology ROS (+)   Anesthesia Other Findings   Reproductive/Obstetrics negative OB ROS                             Anesthesia Physical Anesthesia Plan  ASA: 3  Anesthesia Plan: General LMA   Post-op Pain Management:    Induction:   PONV Risk Score and Plan:   Airway Management Planned:   Additional Equipment:   Intra-op Plan:   Post-operative Plan:   Informed Consent: I have reviewed the patients History and Physical, chart, labs and discussed the procedure including the risks, benefits and alternatives for the proposed anesthesia with the patient or authorized representative who has indicated his/her understanding and acceptance.       Plan Discussed with: CRNA  Anesthesia Plan Comments:        Anesthesia Quick Evaluation

## 2023-03-10 NOTE — H&P (Signed)
History of Present Illness:  Olivia Armstrong is a 56 y.o. female who presents for history and physical for an upcoming right endoscopic carpal tunnel release to be done by Dr. Joice Lofts on March 10, 2023. The patient saw Dr. Joice Lofts last month. The patient's primary concern was her persistent/worsening right hand pain and paresthesias. She notes that the symptoms have been present for many years but have gradually been worsening to the point where she is now being awakened from sleep by these symptoms. She has been wearing splints at night with limited benefit and taking over-the-counter medications with all, also with limited benefit. She has undergone an endoscopic left carpal tunnel release in the past, from which she has done quite well. She notes that the present right wrist and hand symptoms are very similar to what she was experiencing prior to her endoscopic left carpal tunnel release. She would like to consider surgical intervention for presumed carpal tunnel syndrome of her right wrist at this time. The patient is not a diabetic.  Current Outpatient Medications:  ARMOUR THYROID 30 mg tablet TAKE 1 TABLET EVERY MORNING BEFORE BREAKFAST (0630) 90 tablet 3  brimonidine (LUMIFY) 0.025 % Drop Apply to eye  desvenlafaxine succinate (PRISTIQ) 100 MG ER tablet Take 100 mg by mouth once daily  ergocalciferol, vitamin D2, 1,250 mcg (50,000 unit) capsule Take 1 capsule (50,000 Units total) by mouth twice a week 12 capsule 1  estradiol (DOTTI) patch 0.05 mg/24 hr PLACE 1 PATCH ON THE SKIN TWICE A WEEK 24 patch 3  ibuprofen (MOTRIN) 200 MG tablet Take by mouth  progesterone (PROMETRIUM) 200 MG capsule TAKE 1 CAPSULE DAILY 90 capsule 3  tretinoin (RETIN-A) 0.05 % cream Apply 1 Application topically nightly  UNABLE TO FIND Med Name: [employer] Multivitamin  zinc sulfate (ZINC-15 ORAL) Take 1 Application by mouth once daily  zolpidem (AMBIEN) 5 MG tablet Take 1 tablet (5 mg total) by mouth at bedtime 90  tablet 1   Allergies:  Kiwi (Actinidia Chinensis) Anaphylaxis (Mouth and throat swelling and itching)  Remeron [Mirtazapine] Dizziness and creepy sensation   Past Medical History:  Anxiety and depression 09/14/2013  CAD (coronary artery disease)  Cervical spondylosis with radiculopathy  Colon cancer (CMS/HHS-HCC)  Depression  HPV (human papilloma virus) anogenital infection  Hyperlipidemia  Hypothyroidism  Palpitations  Prediabetes   Past Surgical History:  trigger thumb 2015  COMBINED HYSTEROSCOPY DIAGNOSTIC / D&C 02/08/2017  [facility]  DILATION AND CURETTAGE OF UTERUS 02/08/2017 (Dr. Leeroy Bock [facility])  Endoscopic left carpal tunnel release 01/05/2018 (Dr. Joice Lofts)  CHOLECYSTECTOMY 10/07/2018 ([facility] Dr Arrie Senate)  Suspension arthroplasty left thumb CMC joint 10/07/2022 (Dr. Joice Lofts)  COLONOSCOPY  ENDOSCOPY BILE DUCT   Family History:  Thyroid disease Mother Leonie Attar  Colon polyps Mother Xiadani Schmehl  Myocardial Infarction (Heart attack) Father Ezzie Slack  Heart disease Father Jaklyn Souffrant  High blood pressure (Hypertension) Father Akisha Schmidtke  deceased 3244 heart attack  Colon cancer Sister  Myocardial Infarction (Heart attack) Paternal Grandfather  Colon cancer Sister Misty Stanley  Nov 2008  Colon polyps Sister Lizet Lenard   Social History:   Socioeconomic History:  Marital status: Divorced  Number of children: 0  Years of education: 16  Occupational History  Occupation: Environmental health practitioner  Tobacco Use  Smoking status: Former  Current packs/day: 0.00  Types: Cigarettes  Start date: 04/21/1979  Quit date: 04/20/1984  Years since quitting: 38.8  Smokeless tobacco: Never  Vaping Use  Vaping status: Never Used  Substance  and Sexual Activity  Alcohol use: Yes  Alcohol/week: 1.0 standard drink of alcohol  Types: 1 Standard drinks or equivalent per week  Comment: ocassionally  Drug use: No  Sexual activity: Not Currently   Partners: Male  Birth control/protection: Post-menopausal   Social Drivers of Health:   Physicist, medical Strain: Low Risk (08/12/2022)  Overall Financial Resource Strain (CARDIA)  Difficulty of Paying Living Expenses: Not hard at all  Food Insecurity: No Food Insecurity (08/12/2022)  Hunger Vital Sign  Worried About Running Out of Food in the Last Year: Never true  Ran Out of Food in the Last Year: Never true  Transportation Needs: No Transportation Needs (08/12/2022)  PRAPARE - Risk analyst (Medical): No  Lack of Transportation (Non-Medical): No   Review of Systems:  A comprehensive 14 point ROS was performed, reviewed, and the pertinent orthopaedic findings are documented in the HPI.  Physical Exam: Vitals:  03/01/23 1559  BP: 120/70  Weight: 68.9 kg (152 lb)  Height: 160 cm (5\' 3" )  PainSc: 0-No pain   General/Constitutional: The patient appears to be well-nourished, well-developed, and in no acute distress. Neuro/Psych: Normal mood and affect, oriented to person, place and time. Eyes: Non-icteric. Pupils are equal, round, and reactive to light, and exhibit synchronous movement. ENT: Unremarkable. Lymphatic: No palpable adenopathy. Respiratory: Lungs clear to auscultation, Normal chest excursion, No wheezes, and Non-labored breathing Cardiovascular: Regular rate and rhythm. No murmurs. and No edema, swelling or tenderness, except as noted in detailed exam. Integumentary: No impressive skin lesions present, except as noted in detailed exam. Musculoskeletal: Unremarkable, except as noted in detailed exam.  Right wrist/hand exam:  Skin inspection of the right hand is unremarkable. No swelling, erythema, ecchymosis, abrasions, or other skin abnormalities are identified. She has no tenderness to palpation of the dorsal or volar aspect of the wrist, nor does she have any tenderness palpation over the dorsal or palmar aspect of the hand. She exhibits full  active and passive range of motion of the wrist without any pain or catching. She is able to active flex and extend all digits fully without any pain or triggering. She is neurovascularly intact all digits. She has a positive Phalen's test and equivocally positive Tinel's over the carpal tunnel.  Heart: Examination of the heart reveals regular, rate, and rhythm. There is no murmur noted on ascultation. There is a normal apical pulse.  Lungs: Lungs are clear to auscultation. There is no wheeze, rhonchi, or crackles. There is normal expansion of bilateral chest walls.   Assessment: Carpal tunnel syndrome, right.   Plan: I discussed her right wrist/hand pain and paresthesias, she is quite frustrated by these persistent symptoms and function limitations, and is ready to consider more aggressive treatment options. Therefore, I have recommended a surgical procedure, specifically an endoscopic right carpal tunnel release. The procedure was discussed with the patient, as were the potential risks (including bleeding, infection, nerve and/or blood vessel injury, persistent or recurrent pain/paresthesias, weakness of grip, need for further surgery, blood clots, strokes, heart attacks and/or arhythmias, pneumonia, etc.) and benefits. The patient states her understanding and wishes to proceed. All of the patient's questions and concerns were answered. She can call any time with further concerns. She will follow up post-surgery, routine.    H&P reviewed and patient re-examined. No changes.

## 2023-03-10 NOTE — Op Note (Signed)
03/10/2023  9:34 AM  Patient:   Olivia Armstrong  Pre-Op Diagnosis:   Right carpal tunnel syndrome.  Post-Op Diagnosis:   Same.  Procedure:   Endoscopic right carpal tunnel release.  Surgeon:   Maryagnes Amos, MD  Anesthesia:   General LMA  Findings:   As above.  Complications:   None  EBL:   0 cc  Fluids:   300 cc crystalloid  TT:   13 minutes at 250 mmHg  Drains:   None  Closure:   4-0 Prolene interrupted sutures  Brief Clinical Note:   The patient is a 57 year old female with a history of progressively worsening pain and paresthesias to her right hand. Her symptoms have progressed despite medications, activity modification, etc. Her history and examination are consistent with carpal tunnel syndrome. The patient presents at this time for an endoscopic right carpal tunnel release.   Procedure:   The patient was brought into the operating room and lain in the supine position. After adequate general laryngeal mask anesthesia was obtained, the right hand and upper extremity were prepped with ChloraPrep solution before being draped sterilely. Preoperative antibiotics were administered. A timeout was performed to verify the appropriate surgical site before the limb was exsanguinated with an Esmarch and the tourniquet inflated to 250 mmHg.   An approximately 1.5-2 cm incision was made over the volar wrist flexion crease, centered over the palmaris longus tendon. The incision was carried down through the subcutaneous tissues with care taken to identify and protect any neurovascular structures. The distal forearm fascia was penetrated just proximal to the transverse carpal ligament. The soft tissues were released off the superficial and deep surfaces of the distal forearm fascia and this was released proximally for 3-4 cm under direct visualization.  Attention was directed distally. The Therapist, nutritional was passed beneath the transverse carpal ligament along the ulnar aspect of the  carpal tunnel and used to release any adhesions as well as to remove any adherent synovial tissue before first the smaller then the larger of the two dilators were passed beneath the transverse carpal ligament along the ulnar margin of the carpal tunnel. The slotted cannula was introduced and the endoscope was placed into the slotted cannula and the undersurface of the transverse carpal ligament visualized. The distal margin of the transverse carpal ligament was marked by placing a 25-gauge needle percutaneously at Kaplan's cardinal point so that it entered the distal portion of the slotted cannula. Under endoscopic visualization, the transverse carpal ligament was released from proximal to distal using the end-cutting blade. A second pass was performed to ensure complete release of the ligament. The adequacy of release was verified both endoscopically and by palpation using the freer elevator.  The wound was irrigated thoroughly with sterile saline solution before being closed using 4-0 Prolene interrupted sutures. A total of 10 cc of 0.5% plain Sensorcaine was injected in and around the incision before a sterile bulky dressing was applied to the wound. The patient was placed into a volar wrist splint before being awakened, extubated, and returned to the recovery room in satisfactory condition after tolerating the procedure well.

## 2023-03-10 NOTE — Discharge Instructions (Addendum)

## 2023-03-10 NOTE — Anesthesia Procedure Notes (Signed)
Procedure Name: Intubation Date/Time: 03/10/2023 8:54 AM  Performed by: Lysbeth Penner, CRNAPre-anesthesia Checklist: Patient identified, Emergency Drugs available, Suction available and Patient being monitored Patient Re-evaluated:Patient Re-evaluated prior to induction Oxygen Delivery Method: Circle system utilized Preoxygenation: Pre-oxygenation with 100% oxygen Induction Type: IV induction LMA: LMA inserted LMA Size: 3.0 Number of attempts: 1 Airway Equipment and Method: Oral airway Placement Confirmation: ETT inserted through vocal cords under direct vision, positive ETCO2 and breath sounds checked- equal and bilateral Tube secured with: Tape Dental Injury: Teeth and Oropharynx as per pre-operative assessment

## 2023-03-10 NOTE — Transfer of Care (Signed)
Immediate Anesthesia Transfer of Care Note  Patient: Olivia Armstrong  Procedure(s) Performed: CARPAL TUNNEL RELEASE ENDOSCOPIC (Right: Wrist)  Patient Location: PACU  Anesthesia Type:General  Level of Consciousness: drowsy  Airway & Oxygen Therapy: Patient Spontanous Breathing  Post-op Assessment: Report given to RN  Post vital signs: Reviewed and stable  Last Vitals:  Vitals Value Taken Time  BP 108/57 03/10/23 0927  Temp    Pulse 93 03/10/23 0929  Resp 16 03/10/23 0929  SpO2 100 % 03/10/23 0929  Vitals shown include unfiled device data.  Last Pain:  Vitals:   03/10/23 0732  TempSrc: Oral  PainSc: 0-No pain      Patients Stated Pain Goal: 0 (03/10/23 0732)  Complications: There were no known notable events for this encounter.

## 2023-03-11 ENCOUNTER — Encounter: Payer: Self-pay | Admitting: Surgery

## 2023-03-11 NOTE — Anesthesia Postprocedure Evaluation (Signed)
Anesthesia Post Note  Patient: Olivia Armstrong  Procedure(s) Performed: CARPAL TUNNEL RELEASE ENDOSCOPIC (Right: Wrist)  Patient location during evaluation: PACU Anesthesia Type: General Level of consciousness: awake and alert Pain management: pain level controlled Vital Signs Assessment: post-procedure vital signs reviewed and stable Respiratory status: spontaneous breathing, nonlabored ventilation, respiratory function stable and patient connected to nasal cannula oxygen Cardiovascular status: blood pressure returned to baseline and stable Postop Assessment: no apparent nausea or vomiting Anesthetic complications: no   There were no known notable events for this encounter.   Last Vitals:  Vitals:   03/10/23 1000 03/10/23 1017  BP: 127/76 (!) 144/73  Pulse: 82 78  Resp: 11 15  Temp: 36.7 C 36.7 C  SpO2: 99% 100%    Last Pain:  Vitals:   03/10/23 1017  TempSrc: Temporal  PainSc: 0-No pain                 Yevette Edwards

## 2023-03-30 ENCOUNTER — Ambulatory Visit: Payer: BC Managed Care – PPO | Admitting: Mental Health

## 2023-03-30 DIAGNOSIS — F411 Generalized anxiety disorder: Secondary | ICD-10-CM | POA: Diagnosis not present

## 2023-03-30 DIAGNOSIS — F324 Major depressive disorder, single episode, in partial remission: Secondary | ICD-10-CM

## 2023-03-30 NOTE — Progress Notes (Unsigned)
[facility] Psychotherapy Note   Name: Olivia Armstrong Date:  03/30/23 MRN: [id number] DOB: January 16, 1966 PCP: Marguarite Arbour, MD  Time spent: 55 minutes  Treatment;  Ind. therapy  Mental Status Exam:    Appearance:    Casual     Behavior:   Appropriate  Motor:   WNL  Speech/Language:    Clear and Coherent  Affect:   Full range   Mood:   Pleasant, sad, tearful  Thought process:   Logical, linear, goal directed  Thought content:     WNL  Sensory/Perceptual disturbances:     none  Orientation:   x4  Attention:   Good  Concentration:   Good  Memory:   Intact  Fund of knowledge:    Consistent with age and development  Insight:     Good  Judgment:    Good  Impulse Control:   Good     Reported Symptoms:  anxiety    Risk Assessment: Danger to Self:  No Self-injurious Behavior: No Danger to Others: No Duty to Warn:no Physical Aggression / Violence:No  Access to Firearms a concern: No  Gang Involvement:No  Patient / guardian was educated about steps to take if suicide or homicide risk level increases between visits: yes While future psychiatric events cannot be accurately predicted, the patient does not currently require acute inpatient psychiatric care and does not currently meet [facility] involuntary commitment criteria.    Allergies  Allergen Reactions   Kiwi Extract Anaphylaxis    Itchy throat and tongue Mouth and throat swelling and itching   Mirtazapine Other (See Comments)    Dizziness    Diagnoses:    ICD-10-CM   1. Major depressive disorder in partial remission, unspecified whether recurrent (HCC)  F32.4     2. Generalized anxiety disorder  F41.1            Subjective:  Patient arrived on time for today's session.  His been approximately 1 year since patient's last visit.  She shared how she has had some work-related stress but overall likes her job, likes her peers and her supervisor.  She identified challenges with her ongoing  anxiety and depression, expressing some hopeless feelings about being able to make some changes such as her being able to buy her own home.  She went on to share how she has lived at her current residence for the past 40 years, how she has considered moving several times but struggled at times in session to identify reasons why this change will be so challenging emotionally.  She shared more history related to her childhood, her father leaving when she was young, adjusting to the family moving to this area when she was in adolescence.  She stated that she is considered if she needs companionship in her life but at this point, is unsure if this is something she wants to pursue; she leans away from the idea.  Utilized motivational interviewing throughout session to assist patient in clarifying needs and some potential perceived barriers. . Plan:  Patient is to use CBT, mindfulness and coping skills to help manage decrease symptoms associated with their diagnosis.   Long-term goals:   Maintain symptom reduction: The patient will report sustained reduction in symptoms of depression using both CBT and mindfulness interventions. Improve emotional regulation: The patient will learn and apply CBT and mindfulness-based strategies to regulate emotions, such as mindfulness-based stress reduction and cognitive restructuring, and report an improvement in emotional regulation for at least 3  consecutive months progressively.   Short-term goal:  The patient will learn and apply CBT and mindfulness-based coping skills for managing anxiety and practice using it between sessions.       2.   The patient will CBT and mindfulness-based interventions to increase awareness of negative thought patterns and work to reframe them as needed.       3.   Patient to identify self supportive thoughts improving her self confidence to feeling "I am enough".       4.   Patient to care for herself by getting adequate  rest.      Waldron Session, [facility]

## 2023-04-22 ENCOUNTER — Ambulatory Visit: Payer: BC Managed Care – PPO | Admitting: Mental Health

## 2023-05-03 ENCOUNTER — Other Ambulatory Visit: Payer: Self-pay | Admitting: Adult Health

## 2023-05-18 ENCOUNTER — Ambulatory Visit: Payer: BC Managed Care – PPO | Admitting: Mental Health

## 2023-05-18 NOTE — Progress Notes (Unsigned)
[facility] Psychotherapy Note   Name: Olivia Armstrong Date:  05/18/23 MRN: [id number] DOB: [birth year] PCP: Marguarite Arbour, MD  Time spent: 52 minutes  Treatment;  Ind. therapy  Mental Status Exam:    Appearance:    Casual     Behavior:   Appropriate  Motor:   WNL  Speech/Language:    Clear and Coherent  Affect:   Full range   Mood:   Pleasant, sad, tearful  Thought process:   Logical, linear, goal directed  Thought content:     WNL  Sensory/Perceptual disturbances:     none  Orientation:   x4  Attention:   Good  Concentration:   Good  Memory:   Intact  Fund of knowledge:    Consistent with age and development  Insight:     Good  Judgment:    Good  Impulse Control:   Good     Reported Symptoms:  anxiety    Risk Assessment: Danger to Self:  No Self-injurious Behavior: No Danger to Others: No Duty to Warn:no Physical Aggression / Violence:No  Access to Firearms a concern: No  Gang Involvement:No  Patient / guardian was educated about steps to take if suicide or homicide risk level increases between visits: yes While future psychiatric events cannot be accurately predicted, the patient does not currently require acute inpatient psychiatric care and does not currently meet [facility] involuntary commitment criteria.    Allergies  Allergen Reactions   Kiwi Extract Anaphylaxis    Itchy throat and tongue Mouth and throat swelling and itching   Mirtazapine Other (See Comments)    Dizziness    Diagnoses:  No diagnosis found.        Subjective:  Patient arrived on time for today's session.  His been approximately 1 year since patient's last visit.  She shared how she has had some work-related stress but overall likes her job, likes her peers and her supervisor.  She identified challenges with her ongoing anxiety and depression, expressing some hopeless feelings about being able to make some changes such as her being able to buy her own home.   She went on to share how she has lived at her current residence for the past 40 years, how she has considered moving several times but struggled at times in session to identify reasons why this change will be so challenging emotionally.  She shared more history related to her childhood, her father leaving when she was young, adjusting to the family moving to this area when she was in adolescence.  She stated that she is considered if she needs companionship in her life but at this point, is unsure if this is something she wants to pursue; she leans away from the idea.  Utilized motivational interviewing throughout session to assist patient in clarifying needs and some potential perceived barriers. . Plan:  Patient is to use CBT, mindfulness and coping skills to help manage decrease symptoms associated with their diagnosis.   Long-term goals:   Maintain symptom reduction: The patient will report sustained reduction in symptoms of depression using both CBT and mindfulness interventions. Improve emotional regulation: The patient will learn and apply CBT and mindfulness-based strategies to regulate emotions, such as mindfulness-based stress reduction and cognitive restructuring, and report an improvement in emotional regulation for at least 3 consecutive months progressively.   Short-term goal:  The patient will learn and apply CBT and mindfulness-based coping skills for managing anxiety and practice using it between sessions.  2.   The patient will CBT and mindfulness-based interventions to increase awareness of negative thought patterns and work to reframe them as needed.       3.   Patient to identify self supportive thoughts improving her self confidence to feeling "I am enough".       4.   Patient to care for herself by getting adequate rest.      Waldron Session, [facility]

## 2023-05-25 ENCOUNTER — Ambulatory Visit: Payer: BC Managed Care – PPO | Admitting: Mental Health

## 2023-06-07 ENCOUNTER — Ambulatory Visit: Payer: BC Managed Care – PPO | Admitting: Adult Health

## 2023-06-07 ENCOUNTER — Encounter: Payer: Self-pay | Admitting: Adult Health

## 2023-06-07 DIAGNOSIS — F411 Generalized anxiety disorder: Secondary | ICD-10-CM | POA: Diagnosis not present

## 2023-06-07 DIAGNOSIS — F324 Major depressive disorder, single episode, in partial remission: Secondary | ICD-10-CM | POA: Diagnosis not present

## 2023-06-07 DIAGNOSIS — F428 Other obsessive-compulsive disorder: Secondary | ICD-10-CM

## 2023-06-07 DIAGNOSIS — G4700 Insomnia, unspecified: Secondary | ICD-10-CM

## 2023-06-07 MED ORDER — DESVENLAFAXINE SUCCINATE ER 100 MG PO TB24: 100.0000 mg | ORAL_TABLET | Freq: Every day | ORAL | 1 refills | Status: DC

## 2023-06-07 NOTE — Progress Notes (Signed)
[name] [id number] 1965/06/21 58 y.o.  Subjective:   Patient ID:  Olivia Armstrong is a 58 y.o. (DOB 04/03/1966) female.  Chief Complaint: No chief complaint on file.   HPI CLETUS MEHLHOFF presents to the office today for follow-up of MDD, GAD, obsessional thoughts and acts, and insomnia.  Describes mood today as "ok". Pleasant. Denies recent tearfulness. Mood symptoms - reports decreased depression, anxiety and irritability. Reports varying interest and motivation. Denies panic attacks. Decreased irrational thinking. Denies obsessive thoughts. Denies worry, rumination, and over thinking. Mood is improved. Stating "I feel like I'm doing ok". Taking medications as prescribed. Energy levels lower. Active, does not have a regular exercise routine. Enjoys some usual interests and activities. Divorced. Single, not dating. Lives with cat "Eulah Pont" - 17. Sisters in [place] and [place]. Spending time with family. Appetite adequate. Weight loss - 155 from 160 pounds. Sleeps well most nights. Averages 6 to 7 hours during the week and longer on the weekends. Focus and concentration improved. Completing tasks. Managing aspects of household. Works full-time - Teacher, English as a foreign language. Denies SI or HI.  Denies AH or VH. Denies self harm. Substance use - THC daily.  Previous medication trials: Lexapro, Ambien, Prozac, Trazadone, Zoloft   Flowsheet Row Admission (Discharged) from 03/10/2023 in [facility] Admission (Discharged) from 10/30/2022 in [facility] PERIOP Admission (Discharged) from 10/07/2022 in [facility]  C-SSRS RISK CATEGORY No Risk No Risk No Risk        Review of Systems:  Review of Systems  Musculoskeletal:  Negative for gait problem.  Neurological:  Negative for tremors.  Psychiatric/Behavioral:         Please refer to HPI    Medications: I have reviewed the  patient's current medications.  Current Outpatient Medications  Medication Sig Dispense Refill   Brimonidine Tartrate (LUMIFY) 0.025 % SOLN Place 1 drop into both eyes daily as needed (red eyes/allergies).     Cholecalciferol (TRUE VITAMIN D3) 1.25 MG (50000 UT) capsule Take 50,000 Units by mouth once a week.     desvenlafaxine (PRISTIQ) 100 MG 24 hr tablet TAKE 1 TABLET DAILY 90 tablet 0   estradiol (VIVELLE-DOT) 0.05 MG/24HR patch Place 1 patch onto the skin 2 (two) times a week. Change patch on Wed and Sun.     hydrocortisone 2.5 % ointment Apply 1 application topically 2 (two) times daily as needed (itching).      ibuprofen (ADVIL,MOTRIN) 200 MG tablet Take 600 mg by mouth every 6 (six) hours as needed for fever, headache or mild pain.      OVER THE COUNTER MEDICATION Take 1 capsule by mouth daily. nutrafol for women     progesterone (PROMETRIUM) 200 MG capsule Take 200 mg by mouth at bedtime.      thyroid (ARMOUR THYROID) 30 MG tablet Take 30 mg by mouth daily before breakfast.      traMADol (ULTRAM) 50 MG tablet Take 1 tablet (50 mg total) by mouth every 6 (six) hours as needed. (Patient taking differently: Take 50 mg by mouth every 6 (six) hours as needed for severe pain (pain score 7-10).) 20 tablet 0   tretinoin (RETIN-A) 0.05 % cream Apply 1 application topically at bedtime as needed (for skin care).     zolpidem (AMBIEN) 5 MG tablet Take 5 mg by mouth at bedtime.     No current facility-administered medications for this visit.    Medication Side Effects: None  Allergies:  Allergies  Allergen Reactions   Kiwi Extract Anaphylaxis    Itchy throat and tongue Mouth and throat swelling and itching   Mirtazapine Other (See Comments)    Dizziness    Past Medical History:  Diagnosis Date   Anxiety and depression 09/14/2013   Benign recurrent vertigo    pre - 2012.  none recently   Depression 07/30/2016   Dysrhythmia    palpitations.  no treatment. saw dr. Elijah Birk and had  stress test but no diagnosis   GERD (gastroesophageal reflux disease)    winding down on antacids   Hyperlipidemia, unspecified 09/14/2013   Hyperthyroidism    Menopausal symptoms 06/10/2016   Overview:  prometrium 200mg  daily estrdiol patch 0.05mg /day q3days   Palpitations 09/14/2013   PONV (postoperative nausea and vomiting)    Pre-diabetes    S/P trigger finger release 10/27/2013   Vitamin D deficiency     Past Medical History, Surgical history, Social history, and Family history were reviewed and updated as appropriate.   Please see review of systems for further details on the patient's review from today.   Objective:   Physical Exam:  There were no vitals taken for this visit.  Physical Exam Constitutional:      General: She is not in acute distress. Musculoskeletal:        General: No deformity.  Neurological:     Mental Status: She is alert and oriented to person, place, and time.     Coordination: Coordination normal.  Psychiatric:        Attention and Perception: Attention and perception normal. She does not perceive auditory or visual hallucinations.        Mood and Affect: Affect is not labile, blunt, angry or inappropriate.        Speech: Speech normal.        Behavior: Behavior normal.        Thought Content: Thought content normal. Thought content is not paranoid or delusional. Thought content does not include homicidal or suicidal ideation. Thought content does not include homicidal or suicidal plan.        Cognition and Memory: Cognition and memory normal.        Judgment: Judgment normal.     Comments: Insight intact     Lab Review:     Component Value Date/Time   NA 139 10/02/2022 1400   K 4.1 10/02/2022 1400   CL 105 10/02/2022 1400   CO2 25 10/02/2022 1400   GLUCOSE 113 (H) 10/02/2022 1400   BUN 20 10/02/2022 1400   CREATININE 0.77 10/02/2022 1400   CALCIUM 9.2 10/02/2022 1400   GFRNONAA >60 10/02/2022 1400   GFRAA >60 01/26/2017 1218        Component Value Date/Time   WBC 8.0 10/02/2022 1400   RBC 4.52 10/02/2022 1400   HGB 13.9 10/02/2022 1400   HCT 41.0 10/02/2022 1400   PLT 269 10/02/2022 1400   MCV 90.7 10/02/2022 1400   MCH 30.8 10/02/2022 1400   MCHC 33.9 10/02/2022 1400   RDW 12.9 10/02/2022 1400    No results found for: "POCLITH", "LITHIUM"   No results found for: "PHENYTOIN", "PHENOBARB", "VALPROATE", "CBMZ"   .res Assessment: Plan:    Plan:  PDMP reviewed  Pristiq 100mg  every morning   Ambien 5mg  at hs for sleep  Genesight testing reviewed.   RTC 3  months  Patient advised to contact office with any questions, adverse effects, or acute worsening in signs and symptoms.  There  are no diagnoses linked to this encounter.   Please see After Visit Summary for patient specific instructions.  Future Appointments  Date Time Provider Department Center  06/23/2023  5:00 PM Waldron Session, [facility] None    No orders of the defined types were placed in this encounter.   -------------------------------

## 2023-06-23 ENCOUNTER — Ambulatory Visit: Payer: BC Managed Care – PPO | Admitting: Mental Health

## 2023-06-23 DIAGNOSIS — F411 Generalized anxiety disorder: Secondary | ICD-10-CM | POA: Diagnosis not present

## 2023-06-23 NOTE — Progress Notes (Unsigned)
 [facility] Psychotherapy Note   Name: Olivia Armstrong Date:  06/23/23 MRN: [id number] DOB: 1966/04/12 PCP: Marguarite Arbour, MD  Time spent: 50 minutes  Treatment;  Ind. therapy  Mental Status Exam:    Appearance:    Casual     Behavior:   Appropriate  Motor:   WNL  Speech/Language:    Clear and Coherent  Affect:   Full range   Mood:   Pleasant, sad, tearful  Thought process:   Logical, linear, goal directed  Thought content:     WNL  Sensory/Perceptual disturbances:     none  Orientation:   x4  Attention:   Good  Concentration:   Good  Memory:   Intact  Fund of knowledge:    Consistent with age and development  Insight:     Good  Judgment:    Good  Impulse Control:   Good     Reported Symptoms:  anxiety    Risk Assessment: Danger to Self:  No Self-injurious Behavior: No Danger to Others: No Duty to Warn:no Physical Aggression / Violence:No  Access to Firearms a concern: No  Gang Involvement:No  Patient / guardian was educated about steps to take if suicide or homicide risk level increases between visits: yes While future psychiatric events cannot be accurately predicted, the patient does not currently require acute inpatient psychiatric care and does not currently meet [facility] involuntary commitment criteria.    Allergies  Allergen Reactions   Kiwi Extract Anaphylaxis    Itchy throat and tongue Mouth and throat swelling and itching   Mirtazapine Other (See Comments)    Dizziness    Diagnoses:  No diagnosis found.    Subjective:  Patient arrived on time for today's session.  Assessed recent events and progress.  Patient shared how they have been coping with work-related stress, going on to share how over the last few months has been significantly busier at work, having to work 12-hour days, often including weekends.  Patient looks forward to having 2 weeks off starting next week, needing this time to reset.  Through further guided  discovery, patient identified their need to often have difficulty recognizing when to identify and maintain a balance between work and off time.  Ways to do so were explored collaboratively while learning more about her position, responsibilities.She attributes this to her anxiety and also how it adds to her anxiety and stress levels daily.    She stated she has a supportive supervisor which makes her job manageable which she values.  Other ways to cope and care for herself, engaging in interests, having a clear separation between work hours and off time.  She identified how she tries to remind herself "it will be there tomorrow" referring to work tasks.  Provide support and encouragement for patient to maintain a sense of boundaries and self-care.   . Plan:  Patient is to use CBT, mindfulness and coping skills to help manage decrease symptoms associated with their diagnosis.   Long-term goals:   Maintain symptom reduction: The patient will report sustained reduction in symptoms of depression using both CBT and mindfulness interventions. Improve emotional regulation: The patient will learn and apply CBT and mindfulness-based strategies to regulate emotions, such as mindfulness-based stress reduction and cognitive restructuring, and report an improvement in emotional regulation for at least 3 consecutive months progressively.   Short-term goal:  The patient will learn and apply CBT and mindfulness-based coping skills for managing anxiety and practice using it between  sessions.       2.   The patient will CBT and mindfulness-based interventions to increase awareness of negative thought patterns and work to reframe them as needed.       3.   Patient to identify self supportive thoughts improving her self confidence to feeling "I am enough".       4.   Patient to care for herself by getting adequate rest.      Waldron Session, [facility]

## 2023-07-23 ENCOUNTER — Other Ambulatory Visit: Payer: Self-pay | Admitting: Obstetrics and Gynecology

## 2023-07-23 DIAGNOSIS — Z1231 Encounter for screening mammogram for malignant neoplasm of breast: Secondary | ICD-10-CM

## 2023-08-02 ENCOUNTER — Ambulatory Visit (INDEPENDENT_AMBULATORY_CARE_PROVIDER_SITE_OTHER): Admitting: Mental Health

## 2023-08-02 DIAGNOSIS — F324 Major depressive disorder, single episode, in partial remission: Secondary | ICD-10-CM | POA: Diagnosis not present

## 2023-08-02 DIAGNOSIS — F411 Generalized anxiety disorder: Secondary | ICD-10-CM | POA: Diagnosis not present

## 2023-08-02 NOTE — Progress Notes (Unsigned)
 [facility] Psychotherapy Note   Name: Olivia Armstrong Date:  06/23/23 MRN: [id number] DOB: 1966/04/12 PCP: Marguarite Arbour, MD  Time spent: 50 minutes  Treatment;  Ind. therapy  Mental Status Exam:    Appearance:    Casual     Behavior:   Appropriate  Motor:   WNL  Speech/Language:    Clear and Coherent  Affect:   Full range   Mood:   Pleasant, sad, tearful  Thought process:   Logical, linear, goal directed  Thought content:     WNL  Sensory/Perceptual disturbances:     none  Orientation:   x4  Attention:   Good  Concentration:   Good  Memory:   Intact  Fund of knowledge:    Consistent with age and development  Insight:     Good  Judgment:    Good  Impulse Control:   Good     Reported Symptoms:  anxiety    Risk Assessment: Danger to Self:  No Self-injurious Behavior: No Danger to Others: No Duty to Warn:no Physical Aggression / Violence:No  Access to Firearms a concern: No  Gang Involvement:No  Patient / guardian was educated about steps to take if suicide or homicide risk level increases between visits: yes While future psychiatric events cannot be accurately predicted, the patient does not currently require acute inpatient psychiatric care and does not currently meet [facility] involuntary commitment criteria.    Allergies  Allergen Reactions   Kiwi Extract Anaphylaxis    Itchy throat and tongue Mouth and throat swelling and itching   Mirtazapine Other (See Comments)    Dizziness    Diagnoses:  No diagnosis found.    Subjective:  Patient arrived on time for today's session.  Assessed recent events and progress.  Patient shared how they have been coping with work-related stress, going on to share how over the last few months has been significantly busier at work, having to work 12-hour days, often including weekends.  Patient looks forward to having 2 weeks off starting next week, needing this time to reset.  Through further guided  discovery, patient identified their need to often have difficulty recognizing when to identify and maintain a balance between work and off time.  Ways to do so were explored collaboratively while learning more about her position, responsibilities.She attributes this to her anxiety and also how it adds to her anxiety and stress levels daily.    She stated she has a supportive supervisor which makes her job manageable which she values.  Other ways to cope and care for herself, engaging in interests, having a clear separation between work hours and off time.  She identified how she tries to remind herself "it will be there tomorrow" referring to work tasks.  Provide support and encouragement for patient to maintain a sense of boundaries and self-care.   . Plan:  Patient is to use CBT, mindfulness and coping skills to help manage decrease symptoms associated with their diagnosis.   Long-term goals:   Maintain symptom reduction: The patient will report sustained reduction in symptoms of depression using both CBT and mindfulness interventions. Improve emotional regulation: The patient will learn and apply CBT and mindfulness-based strategies to regulate emotions, such as mindfulness-based stress reduction and cognitive restructuring, and report an improvement in emotional regulation for at least 3 consecutive months progressively.   Short-term goal:  The patient will learn and apply CBT and mindfulness-based coping skills for managing anxiety and practice using it between  sessions.       2.   The patient will CBT and mindfulness-based interventions to increase awareness of negative thought patterns and work to reframe them as needed.       3.   Patient to identify self supportive thoughts improving her self confidence to feeling "I am enough".       4.   Patient to care for herself by getting adequate rest.      Waldron Session, [facility]

## 2023-08-11 ENCOUNTER — Ambulatory Visit
Admission: RE | Admit: 2023-08-11 | Discharge: 2023-08-11 | Disposition: A | Discharge status: Home / Self Care | Source: Ambulatory Visit | Attending: Obstetrics and Gynecology | Admitting: Obstetrics and Gynecology

## 2023-08-11 DIAGNOSIS — Z1231 Encounter for screening mammogram for malignant neoplasm of breast: Secondary | ICD-10-CM | POA: Insufficient documentation

## 2023-08-30 ENCOUNTER — Encounter: Payer: Self-pay | Admitting: Adult Health

## 2023-08-30 ENCOUNTER — Ambulatory Visit: Payer: BC Managed Care – PPO | Admitting: Adult Health

## 2023-08-30 DIAGNOSIS — G4700 Insomnia, unspecified: Secondary | ICD-10-CM

## 2023-08-30 DIAGNOSIS — F411 Generalized anxiety disorder: Secondary | ICD-10-CM | POA: Diagnosis not present

## 2023-08-30 DIAGNOSIS — F329 Major depressive disorder, single episode, unspecified: Secondary | ICD-10-CM

## 2023-08-30 DIAGNOSIS — F428 Other obsessive-compulsive disorder: Secondary | ICD-10-CM

## 2023-08-30 DIAGNOSIS — F324 Major depressive disorder, single episode, in partial remission: Secondary | ICD-10-CM

## 2023-08-30 MED ORDER — DESVENLAFAXINE SUCCINATE ER 100 MG PO TB24: 100.0000 mg | ORAL_TABLET | Freq: Every day | ORAL | 1 refills | Status: DC

## 2023-08-30 MED ORDER — ZOLPIDEM TARTRATE 5 MG PO TABS
5.0000 mg | ORAL_TABLET | Freq: Every day | ORAL | 0 refills | Status: DC
Start: 2023-08-30 — End: 2023-12-07

## 2023-08-30 NOTE — Progress Notes (Signed)
 Olivia SFERRAZZA [id number] [birth year] 58 y.o.  Virtual Visit via Telephone Note  I connected with pt on 08/30/23 at  4:15 PM EDT by telephone and verified that I am speaking with the correct person using two identifiers.   I discussed the limitations, risks, security and privacy concerns of performing an evaluation and management service by telephone and the availability of in person appointments. I also discussed with the patient that there may be a patient responsible charge related to this service. The patient expressed understanding and agreed to proceed.   I discussed the assessment and treatment plan with the patient. The patient was provided an opportunity to ask questions and all were answered. The patient agreed with the plan and demonstrated an understanding of the instructions.   The patient was advised to call back or seek an in-person evaluation if the symptoms worsen or if the condition fails to improve as anticipated.  I provided 25 minutes of non-face-to-face time during this encounter.  The patient was located at home.  The provider was located at [facility].   Reagan Camera, NP   Subjective:   Patient ID:  Olivia Armstrong is a 58 y.o. (DOB [birth year]) female.  Chief Complaint: No chief complaint on file.   HPI TERRAL ZUCCARELLI presents for follow-up of MDD, GAD, obsessional thoughts and acts and insomnia.  Describes mood today as "ok". Pleasant. Denies recent tearfulness. Mood symptoms - reports decreased depression, anxiety and irritability. Reports improved interest and motivation. Denies panic attacks. Reports decreased irrational thinking. Denies obsessive thoughts. Denies worry, rumination, and over thinking. Reports mood is stable. Stating "I feel like I'm doing pretty good". Taking medications as prescribed. Energy levels lower. Active, does not have a regular exercise routine. Enjoys some usual interests and activities. Divorced. Single, not  dating. Lives with cat "[name]" - 17. Sisters in [place] and [place]. Spending time with family. Appetite adequate. Weight loss - 150 from 155 pounds. Sleeps well most nights. Averages 6 to 7 hours during the week and longer on the weekends. Focus and concentration improved. Completing tasks. Managing aspects of household. Works full-time - Teacher, English as a foreign language. Denies SI or HI.  Denies AH or VH. Denies self harm. Reports substance use - THC daily.  Previous medication trials: Lexapro, Ambien, Prozac, Trazadone, Zoloft  Review of Systems:  Review of Systems  Musculoskeletal:  Negative for gait problem.  Neurological:  Negative for tremors.  Psychiatric/Behavioral:         Please refer to HPI    Medications: I have reviewed the patient's current medications.  Current Outpatient Medications  Medication Sig Dispense Refill   Brimonidine Tartrate (LUMIFY) 0.025 % SOLN Place 1 drop into both eyes daily as needed (red eyes/allergies).     Cholecalciferol (TRUE VITAMIN D3) 1.25 MG (50000 UT) capsule Take 50,000 Units by mouth once a week.     desvenlafaxine  (PRISTIQ ) 100 MG 24 hr tablet Take 1 tablet (100 mg total) by mouth daily. 90 tablet 1   estradiol (VIVELLE-DOT) 0.05 MG/24HR patch Place 1 patch onto the skin 2 (two) times a week. Change patch on Wed and Sun.     hydrocortisone 2.5 % ointment Apply 1 application topically 2 (two) times daily as needed (itching).      ibuprofen (ADVIL,MOTRIN) 200 MG tablet Take 600 mg by mouth every 6 (six) hours as needed for fever, headache or mild pain.      OVER THE COUNTER MEDICATION Take 1 capsule by mouth daily. nutrafol  for women     progesterone (PROMETRIUM) 200 MG capsule Take 200 mg by mouth at bedtime.      thyroid (ARMOUR THYROID) 30 MG tablet Take 30 mg by mouth daily before breakfast.      traMADol  (ULTRAM ) 50 MG tablet Take 1 tablet (50 mg total) by mouth every 6 (six) hours as needed. (Patient taking differently: Take 50 mg by  mouth every 6 (six) hours as needed for severe pain (pain score 7-10).) 20 tablet 0   tretinoin (RETIN-A) 0.05 % cream Apply 1 application topically at bedtime as needed (for skin care).     zolpidem (AMBIEN) 5 MG tablet Take 5 mg by mouth at bedtime.     No current facility-administered medications for this visit.    Medication Side Effects: None  Allergies:  Allergies  Allergen Reactions   Kiwi Extract Anaphylaxis    Itchy throat and tongue Mouth and throat swelling and itching   Mirtazapine Other (See Comments)    Dizziness    Past Medical History:  Diagnosis Date   Anxiety and depression 09/14/2013   Benign recurrent vertigo    pre - 2012.  none recently   Depression 07/30/2016   Dysrhythmia    palpitations.  no treatment. saw dr. Doree Games and had stress test but no diagnosis   GERD (gastroesophageal reflux disease)    winding down on antacids   Hyperlipidemia, unspecified 09/14/2013   Hyperthyroidism    Menopausal symptoms 06/10/2016   Overview:  prometrium 200mg  daily estrdiol patch 0.05mg /day q3days   Palpitations 09/14/2013   PONV (postoperative nausea and vomiting)    Pre-diabetes    S/P trigger finger release 10/27/2013   Vitamin D deficiency     Family History  Problem Relation Age of Onset   Breast cancer Maternal Grandmother 90       mat great gm   Colon cancer Sister     Social History   Socioeconomic History   Marital status: Divorced    Spouse name: Not on file   Number of children: Not on file   Years of education: Not on file   Highest education level: Not on file  Occupational History   Not on file  Tobacco Use   Smoking status: Never   Smokeless tobacco: Never  Vaping Use   Vaping status: Never Used  Substance and Sexual Activity   Alcohol use: Yes    Comment: occasionally   Drug use: No   Sexual activity: Never  Other Topics Concern   Not on file  Social History Narrative   Not on file   Social Drivers of Health    Financial Resource Strain: Low Risk  (08/13/2023)   [facility]   Overall Financial Resource Strain (CARDIA)    Difficulty of Paying Living Expenses: Not hard at all  Food Insecurity: No Food Insecurity (08/13/2023)   Received from [facility]   Hunger Vital Sign    Worried About Running Out of Food in the Last Year: Never true    Ran Out of Food in the Last Year: Never true  Transportation Needs: No Transportation Needs (08/13/2023)   Received from [facility] - Transportation    In the past 12 months, has lack of transportation kept you from medical appointments or from getting medications?: No    Lack of Transportation (Non-Medical): No  Physical Activity: Not on file  Stress: Not on file  Social Connections: Not  on file  Intimate Partner Violence: Not on file    Past Medical History, Surgical history, Social history, and Family history were reviewed and updated as appropriate.   Please see review of systems for further details on the patient's review from today.   Objective:   Physical Exam:  There were no vitals taken for this visit.  Physical Exam Constitutional:      General: She is not in acute distress. Musculoskeletal:        General: No deformity.  Neurological:     Mental Status: She is alert and oriented to person, place, and time.     Coordination: Coordination normal.  Psychiatric:        Attention and Perception: Attention and perception normal. She does not perceive auditory or visual hallucinations.        Mood and Affect: Affect is not labile, blunt, angry or inappropriate.        Speech: Speech normal.        Behavior: Behavior normal.        Thought Content: Thought content normal. Thought content is not paranoid or delusional. Thought content does not include homicidal or suicidal ideation. Thought content does not include homicidal or suicidal plan.        Cognition and  Memory: Cognition and memory normal.        Judgment: Judgment normal.     Comments: Insight intact     Lab Review:     Component Value Date/Time   NA 139 10/02/2022 1400   K 4.1 10/02/2022 1400   CL 105 10/02/2022 1400   CO2 25 10/02/2022 1400   GLUCOSE 113 (H) 10/02/2022 1400   BUN 20 10/02/2022 1400   CREATININE 0.77 10/02/2022 1400   CALCIUM 9.2 10/02/2022 1400   GFRNONAA >60 10/02/2022 1400   GFRAA >60 01/26/2017 1218       Component Value Date/Time   WBC 8.0 10/02/2022 1400   RBC 4.52 10/02/2022 1400   HGB 13.9 10/02/2022 1400   HCT 41.0 10/02/2022 1400   PLT 269 10/02/2022 1400   MCV 90.7 10/02/2022 1400   MCH 30.8 10/02/2022 1400   MCHC 33.9 10/02/2022 1400   RDW 12.9 10/02/2022 1400    No results found for: "POCLITH", "LITHIUM"   No results found for: "PHENYTOIN", "PHENOBARB", "VALPROATE", "CBMZ"   .res Assessment: Plan:    Plan:  PDMP reviewed  Pristiq  100mg  every morning   Ambien 5mg  at hs for sleep  Genesight testing available  RTC 3 months  Patient advised to contact office with any questions, adverse effects, or acute worsening in signs and symptoms.  There are no diagnoses linked to this encounter.   Please see After Visit Summary for patient specific instructions.  Future Appointments  Date Time Provider Department Center  08/30/2023  4:15 PM Nelva Hauk, Ursula Gardner, NP [facility] None  09/14/2023  5:00 PM Avram Lenis, [facility] None  10/12/2023  5:00 PM Avram Lenis, [facility] None    No orders of the defined types were placed in this encounter.     -------------------------------

## 2023-09-14 ENCOUNTER — Ambulatory Visit (INDEPENDENT_AMBULATORY_CARE_PROVIDER_SITE_OTHER): Admitting: Mental Health

## 2023-09-14 DIAGNOSIS — F324 Major depressive disorder, single episode, in partial remission: Secondary | ICD-10-CM | POA: Diagnosis not present

## 2023-09-14 DIAGNOSIS — F411 Generalized anxiety disorder: Secondary | ICD-10-CM

## 2023-09-14 NOTE — Progress Notes (Unsigned)
 [facility] Psychotherapy Note   Name: Olivia Armstrong Date:  09/14/23 MRN: [id number] DOB: 1965-12-25 PCP: Yehuda Helms, MD  Time spent: 50 minutes  Treatment;  Ind. therapy  Mental Status Exam:    Appearance:    Casual     Behavior:   Appropriate  Motor:   WNL  Speech/Language:    Clear and Coherent  Affect:   Full range   Mood:   Pleasant, sad, tearful  Thought process:   Logical, linear, goal directed  Thought content:     WNL  Sensory/Perceptual disturbances:     none  Orientation:   x4  Attention:   Good  Concentration:   Good  Memory:   Intact  Fund of knowledge:    Consistent with age and development  Insight:     Good  Judgment:    Good  Impulse Control:   Good     Reported Symptoms:  anxiety    Risk Assessment: Danger to Self:  No Self-injurious Behavior: No Danger to Others: No Duty to Warn:no Physical Aggression / Violence:No  Access to Firearms a concern: No  Gang Involvement:No  Patient / guardian was educated about steps to take if suicide or homicide risk level increases between visits: yes While future psychiatric events cannot be accurately predicted, the patient does not currently require acute inpatient psychiatric care and does not currently meet [place]  involuntary commitment criteria.    Allergies  Allergen Reactions   Kiwi Extract Anaphylaxis    Itchy throat and tongue Mouth and throat swelling and itching   Mirtazapine Other (See Comments)    Dizziness    Diagnoses:  No diagnosis found.     Subjective:  Patient arrived on time for today's session.  Patient shared how she had a pleasant vacation that lasted about 2 weeks.  She stated this was needed and she was able to not focus on work which allowed her stress levels to decrease.  She went on to share challenges at work, some with coworkers as when individual is often less productive on her team.  She identifies her focus which is to be productive and  efficient with her job duties, reaching out for help when needed.  Some feelings of anxiety and critical self-talk was identified related to her not knowing as much information as she would like at this point.  Patient was encouraged to recognize how she has reached out to her supervisor and other coworkers and how they have been supportive and reassuring to her work efforts.  Facilitated her identifying what she feels may be helpful to remind herself, "I am enough".     Plan:  Patient is to use CBT, mindfulness and coping skills to help manage decrease symptoms associated with their diagnosis.   Long-term goals:   Maintain symptom reduction: The patient will report sustained reduction in symptoms of depression using both CBT and mindfulness interventions. Improve emotional regulation: The patient will learn and apply CBT and mindfulness-based strategies to regulate emotions, such as mindfulness-based stress reduction and cognitive restructuring, and report an improvement in emotional regulation for at least 3 consecutive months progressively.   Short-term goal:  The patient will learn and apply CBT and mindfulness-based coping skills for managing anxiety and practice using it between sessions.       2.   The patient will CBT and mindfulness-based interventions to increase awareness of negative thought patterns and work to reframe them as needed.  3.   Patient to identify self supportive thoughts improving her self confidence to feeling "I am enough".       4.   Patient to care for herself by getting adequate rest.      Avram Lenis, [facility]

## 2023-10-02 ENCOUNTER — Other Ambulatory Visit: Payer: Self-pay | Admitting: Adult Health

## 2023-10-02 DIAGNOSIS — F411 Generalized anxiety disorder: Secondary | ICD-10-CM

## 2023-10-02 DIAGNOSIS — F324 Major depressive disorder, single episode, in partial remission: Secondary | ICD-10-CM

## 2023-10-12 ENCOUNTER — Ambulatory Visit (INDEPENDENT_AMBULATORY_CARE_PROVIDER_SITE_OTHER): Admitting: Mental Health

## 2023-10-12 ENCOUNTER — Ambulatory Visit: Admitting: Mental Health

## 2023-10-12 DIAGNOSIS — F411 Generalized anxiety disorder: Secondary | ICD-10-CM | POA: Diagnosis not present

## 2023-10-12 NOTE — Progress Notes (Signed)
 [facility] Psychotherapy Note   Name: Olivia Armstrong Date:  10/12/23 MRN: [id number] DOB: 09/08/1965 PCP: Auston Reyes BIRCH, MD  Time spent: 50 minutes  Treatment;  Ind. therapy  Mental Status Exam:    Appearance:    Casual     Behavior:   Appropriate  Motor:   WNL  Speech/Language:    Clear and Coherent  Affect:   Full range   Mood:   Euthymic  Thought process:   Logical, linear, goal directed  Thought content:     WNL  Sensory/Perceptual disturbances:     none  Orientation:   x4  Attention:   Good  Concentration:   Good  Memory:   Intact  Fund of knowledge:    Consistent with age and development  Insight:     Good  Judgment:    Good  Impulse Control:   Good     Reported Symptoms:  anxiety    Risk Assessment: Danger to Self:  No Self-injurious Behavior: No Danger to Others: No Duty to Warn:no Physical Aggression / Violence:No  Access to Firearms a concern: No  Gang Involvement:No  Patient / guardian was educated about steps to take if suicide or homicide risk level increases between visits: yes While future psychiatric events cannot be accurately predicted, the patient does not currently require acute inpatient psychiatric care and does not currently meet [place]  involuntary commitment criteria.    Allergies  Allergen Reactions   Kiwi Extract Anaphylaxis    Itchy throat and tongue Mouth and throat swelling and itching   Mirtazapine Other (See Comments)    Dizziness    Diagnoses:  No diagnosis found.      Subjective:  Patient arrived on time for today's session.  Patient shared progress, how she continues to consider moving out of state to live closer to her mother.  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  She went on to share many positive aspects of her being able to make this change and be a support to her mother physically due to her being  elderly.  She went on to share work-related stress, trying to keep up with significant projects.  She continues to have moments where she feels feelings of inadequacy.  Facilitated her identifying ways she has been resourceful, improving her knowledge and efficiency while utilizing her support system which consists of some coworkers and her supervisors are specifically his significantly supportive and understanding.  Reviewed the significance of being mindful of thoughts she has in working to reframe them with a focus on being self supportive as this can help her manage stress and worry.   Plan:  Patient is to use coping skills to help manage decrease symptoms associated with their diagnosis.   Long-term goals:   Maintain symptom reduction: The patient will report sustained reduction in symptoms of depression using both CBT and mindfulness interventions. Improve emotional regulation: The patient will learn and apply CBT and mindfulness-based strategies to regulate emotions, such as mindfulness-based stress reduction and cognitive restructuring, and report an improvement in emotional regulation for at least 3 consecutive months progressively.   Short-term goal:  The patient will learn and apply CBT and mindfulness-based coping skills for managing anxiety and practice using it between sessions.       2.   The patient will CBT and mindfulness-based interventions to increase awareness of negative thought patterns and work to reframe them  as needed.       3.   Patient to identify self supportive thoughts improving her self confidence to feeling I am enough.       4.   Patient to care for herself by getting adequate rest.      Lonni Fischer, [facility]

## 2023-10-14 ENCOUNTER — Ambulatory Visit: Admitting: Mental Health

## 2023-11-10 ENCOUNTER — Ambulatory Visit (INDEPENDENT_AMBULATORY_CARE_PROVIDER_SITE_OTHER): Admitting: Mental Health

## 2023-11-10 DIAGNOSIS — F411 Generalized anxiety disorder: Secondary | ICD-10-CM | POA: Diagnosis not present

## 2023-11-10 NOTE — Progress Notes (Signed)
 [facility] Psychotherapy Note   Name: Olivia Armstrong Date: 11/10/23 MRN: [id number] DOB: [birth year] PCP: Auston Reyes BIRCH, MD  Time spent: 49 minutes  Treatment;  Ind. therapy  Mental Status Exam:    Appearance:    Casual     Behavior:   Appropriate  Motor:   WNL  Speech/Language:    Clear and Coherent  Affect:   Full range   Mood:   Euthymic  Thought process:   Logical, linear, goal directed  Thought content:     WNL  Sensory/Perceptual disturbances:     none  Orientation:   x4  Attention:   Good  Concentration:   Good  Memory:   Intact  Fund of knowledge:    Consistent with age and development  Insight:     Good  Judgment:    Good  Impulse Control:   Good     Reported Symptoms:  anxiety    Risk Assessment: Danger to Self:  No Self-injurious Behavior: No Danger to Others: No Duty to Warn:no Physical Aggression / Violence:No  Access to Firearms a concern: No  Gang Involvement:No  Patient / guardian was educated about steps to take if suicide or homicide risk level increases between visits: yes While future psychiatric events cannot be accurately predicted, the patient does not currently require acute inpatient psychiatric care and does not currently meet [place]  involuntary commitment criteria.    Allergies  Allergen Reactions   Kiwi Extract Anaphylaxis    Itchy throat and tongue Mouth and throat swelling and itching   Mirtazapine Other (See Comments)    Dizziness    Diagnoses:    ICD-10-CM   1. Generalized anxiety disorder  F41.1         Subjective:  Patient arrived on time for today's session.  Patient shared how she has solidified plans to move out of town to be closer with her mother.  She went on to share details related to being able to make these plans, follow through and details.  She continues to work her full-time job, states she has been keeping up well recently and has had less anxiety and stress, although she stays very  busy with the work.  With her relocating, she stated her job, which is remote, will be maintained after the move.  She identified hopes and expectations upon moving back to her home town which she stated is considerably [place] with just a few stores and limited variety. Facilitated her identifying feelings where she identified some anxiety about the move but overall feeling hopeful and positive.  Patient was encouraged to recognize how she is taking considerable steps to ensure for a smooth transition.  She had some concerns talking to her landlord about leaving and selling her home.  Ways to communicate effectively was explored collaboratively.    Plan:  Patient is to use coping skills to help manage decrease symptoms associated with their diagnosis.   Long-term goals:   Maintain symptom reduction: The patient will report sustained reduction in symptoms of depression using both CBT and mindfulness interventions. Improve emotional regulation: The patient will learn and apply CBT and mindfulness-based strategies to regulate emotions, such as mindfulness-based stress reduction and cognitive restructuring, and report an improvement in emotional regulation for at least 3 consecutive months progressively.   Short-term goal:  The patient will learn and apply CBT and mindfulness-based coping skills for managing anxiety and practice using it between sessions.       2.  The patient will CBT and mindfulness-based interventions to increase awareness of negative thought patterns and work to reframe them as needed.       3.   Patient to identify self supportive thoughts improving her self confidence to feeling I am enough.       4.   Patient to care for herself by getting adequate rest.      Lonni Fischer, [facility]

## 2023-12-07 ENCOUNTER — Ambulatory Visit (INDEPENDENT_AMBULATORY_CARE_PROVIDER_SITE_OTHER): Admitting: Adult Health

## 2023-12-07 ENCOUNTER — Encounter: Payer: Self-pay | Admitting: Adult Health

## 2023-12-07 DIAGNOSIS — F411 Generalized anxiety disorder: Secondary | ICD-10-CM

## 2023-12-07 DIAGNOSIS — G4700 Insomnia, unspecified: Secondary | ICD-10-CM

## 2023-12-07 DIAGNOSIS — F324 Major depressive disorder, single episode, in partial remission: Secondary | ICD-10-CM | POA: Diagnosis not present

## 2023-12-07 DIAGNOSIS — F428 Other obsessive-compulsive disorder: Secondary | ICD-10-CM

## 2023-12-07 MED ORDER — DESVENLAFAXINE SUCCINATE ER 100 MG PO TB24: 100.0000 mg | ORAL_TABLET | Freq: Every day | ORAL | 0 refills | Status: AC

## 2023-12-07 MED ORDER — ZOLPIDEM TARTRATE 5 MG PO TABS
5.0000 mg | ORAL_TABLET | Freq: Every day | ORAL | 0 refills | Status: DC
Start: 2023-12-07 — End: 2024-01-05

## 2023-12-07 NOTE — Progress Notes (Signed)
 Olivia Armstrong [id number] 10/14/1965 58 y.o.  Subjective:   Patient ID:  Olivia Armstrong is a 58 y.o. (DOB 08/27/1965) female.  Chief Complaint: No chief complaint on file.   HPI Olivia Armstrong presents to the office today for follow-up of MDD, GAD, obsessional thoughts and acts and insomnia.  Describes mood today as ok. Pleasant. Denies recent tearfulness. Mood symptoms - reports decreased depression, anxiety and irritability. Reports stable interest and motivation. Denies panic attacks. Reports decreased irrational thinking. Denies some obsessive thoughts. Denies worry, rumination and over thinking - work related. Reports mood is stable. Stating I feel like I'm doing ok. Taking medications as prescribed. Energy levels lower. Active, does not have a regular exercise routine. Enjoys some usual interests and activities. Divorced. Single. Lives with cat [name] - 15. Sisters in [place] and [place]. Spending time with family. Appetite adequate. Weight stable - 150 from 155 pounds. Sleeps well most nights. Averages 6 to 7 hours during the week and longer on the weekends. Focus and concentration improved. Completing tasks. Managing aspects of household. Works full-time - Teacher, English as a foreign language. Denies SI or HI.  Denies AH or VH. Denies self harm. Reports substance use - THC daily.  Previous medication trials: Lexapro, Ambien , Prozac, Trazadone, Zoloft   Flowsheet Row Admission (Discharged) from 03/10/2023 in [facility] Admission (Discharged) from 10/30/2022 in [facility] Admission (Discharged) from 10/07/2022 in [facility]  C-SSRS RISK CATEGORY No Risk No Risk No Risk     Review of Systems:  Review of Systems  Musculoskeletal:  Negative for gait problem.  Neurological:  Negative for tremors.  Psychiatric/Behavioral:         Please refer to HPI     Medications: I have reviewed the patient's current medications.  Current Outpatient Medications  Medication Sig Dispense Refill   Brimonidine Tartrate (LUMIFY) 0.025 % SOLN Place 1 drop into both eyes daily as needed (red eyes/allergies).     Cholecalciferol (TRUE VITAMIN D3) 1.25 MG (50000 UT) capsule Take 50,000 Units by mouth once a week.     desvenlafaxine  (PRISTIQ ) 100 MG 24 hr tablet Take 1 tablet (100 mg total) by mouth daily. 90 tablet 0   estradiol (VIVELLE-DOT) 0.05 MG/24HR patch Place 1 patch onto the skin 2 (two) times a week. Change patch on Wed and Sun.     hydrocortisone 2.5 % ointment Apply 1 application topically 2 (two) times daily as needed (itching).      ibuprofen (ADVIL,MOTRIN) 200 MG tablet Take 600 mg by mouth every 6 (six) hours as needed for fever, headache or mild pain.      OVER THE COUNTER MEDICATION Take 1 capsule by mouth daily. nutrafol for women     progesterone (PROMETRIUM) 200 MG capsule Take 200 mg by mouth at bedtime.      thyroid (ARMOUR THYROID) 30 MG tablet Take 30 mg by mouth daily before breakfast.      tretinoin (RETIN-A) 0.05 % cream Apply 1 application topically at bedtime as needed (for skin care).     zolpidem  (AMBIEN ) 5 MG tablet Take 1 tablet (5 mg total) by mouth at bedtime. 90 tablet 0   No current facility-administered medications for this visit.    Medication Side Effects: None  Allergies:  Allergies  Allergen Reactions   Kiwi Extract Anaphylaxis    Itchy throat and tongue Mouth and throat swelling and itching   Mirtazapine Other (See Comments)  Dizziness    Past Medical History:  Diagnosis Date   Anxiety and depression 09/14/2013   Benign recurrent vertigo    pre - 2012.  none recently   Depression 07/30/2016   Dysrhythmia    palpitations.  no treatment. saw dr. meliton and had stress test but no diagnosis   GERD (gastroesophageal reflux disease)    winding down on antacids   Hyperlipidemia, unspecified 09/14/2013    Hyperthyroidism    Menopausal symptoms 06/10/2016   Overview:  prometrium 200mg  daily estrdiol patch 0.05mg /day q3days   Palpitations 09/14/2013   PONV (postoperative nausea and vomiting)    Pre-diabetes    S/P trigger finger release 10/27/2013   Vitamin D deficiency     Past Medical History, Surgical history, Social history, and Family history were reviewed and updated as appropriate.   Please see review of systems for further details on the patient's review from today.   Objective:   Physical Exam:  There were no vitals taken for this visit.  Physical Exam Constitutional:      General: She is not in acute distress. Musculoskeletal:        General: No deformity.  Neurological:     Mental Status: She is alert and oriented to person, place, and time.     Coordination: Coordination normal.  Psychiatric:        Attention and Perception: Attention and perception normal. She does not perceive auditory or visual hallucinations.        Mood and Affect: Mood normal. Mood is not anxious or depressed. Affect is not labile, blunt, angry or inappropriate.        Speech: Speech normal.        Behavior: Behavior normal.        Thought Content: Thought content normal. Thought content is not paranoid or delusional. Thought content does not include homicidal or suicidal ideation. Thought content does not include homicidal or suicidal plan.        Cognition and Memory: Cognition and memory normal.        Judgment: Judgment normal.     Comments: Insight intact     Lab Review:     Component Value Date/Time   NA 139 10/02/2022 1400   K 4.1 10/02/2022 1400   CL 105 10/02/2022 1400   CO2 25 10/02/2022 1400   GLUCOSE 113 (H) 10/02/2022 1400   BUN 20 10/02/2022 1400   CREATININE 0.77 10/02/2022 1400   CALCIUM 9.2 10/02/2022 1400   GFRNONAA >60 10/02/2022 1400   GFRAA >60 01/26/2017 1218       Component Value Date/Time   WBC 8.0 10/02/2022 1400   RBC 4.52 10/02/2022 1400   HGB 13.9  10/02/2022 1400   HCT 41.0 10/02/2022 1400   PLT 269 10/02/2022 1400   MCV 90.7 10/02/2022 1400   MCH 30.8 10/02/2022 1400   MCHC 33.9 10/02/2022 1400   RDW 12.9 10/02/2022 1400    No results found for: POCLITH, LITHIUM   No results found for: PHENYTOIN, PHENOBARB, VALPROATE, CBMZ   .res Assessment: Plan:    Plan:  PDMP reviewed  Pristiq  100mg  every morning   Ambien  5mg  at hs for sleep  Genesight testing available  RTC 3 months  15 minutes spent dedicated to the care of this patient on the date of this encounter to include pre-visit review of records, ordering of medication, post visit documentation, and face-to-face time with the patient discussing MDD, GAD, obsessional thoughts and acts and insomnia. Discussed continuing current  medication regimen.  Patient advised to contact office with any questions, adverse effects, or acute worsening in signs and symptoms.  Diagnoses and all orders for this visit:  Major depressive disorder in partial remission, unspecified whether recurrent (HCC) -     desvenlafaxine  (PRISTIQ ) 100 MG 24 hr tablet; Take 1 tablet (100 mg total) by mouth daily.  Generalized anxiety disorder -     desvenlafaxine  (PRISTIQ ) 100 MG 24 hr tablet; Take 1 tablet (100 mg total) by mouth daily.  Obsessional thoughts  Insomnia, unspecified type -     zolpidem  (AMBIEN ) 5 MG tablet; Take 1 tablet (5 mg total) by mouth at bedtime.     Please see After Visit Summary for patient specific instructions.  No future appointments.  No orders of the defined types were placed in this encounter.   -------------------------------

## 2024-01-05 ENCOUNTER — Other Ambulatory Visit: Payer: Self-pay | Admitting: Adult Health

## 2024-01-05 DIAGNOSIS — G4700 Insomnia, unspecified: Secondary | ICD-10-CM

## 2024-01-20 ENCOUNTER — Ambulatory Visit: Admitting: Mental Health

## 2024-01-31 ENCOUNTER — Ambulatory Visit

## 2024-02-01 ENCOUNTER — Ambulatory Visit (INDEPENDENT_AMBULATORY_CARE_PROVIDER_SITE_OTHER): Admitting: Adult Health

## 2024-02-01 ENCOUNTER — Encounter: Payer: Self-pay | Admitting: Adult Health

## 2024-02-01 DIAGNOSIS — G4700 Insomnia, unspecified: Secondary | ICD-10-CM

## 2024-02-01 DIAGNOSIS — F428 Other obsessive-compulsive disorder: Secondary | ICD-10-CM | POA: Diagnosis not present

## 2024-02-01 DIAGNOSIS — F411 Generalized anxiety disorder: Secondary | ICD-10-CM

## 2024-02-01 DIAGNOSIS — F324 Major depressive disorder, single episode, in partial remission: Secondary | ICD-10-CM | POA: Diagnosis not present

## 2024-02-01 NOTE — Progress Notes (Signed)
 Olivia Armstrong [id number] Jul 16, 1965 57 y.o.  Subjective:   Patient ID:  Olivia Armstrong is a 58 y.o. (DOB 13-Mar-1966) female.  Chief Complaint: No chief complaint on file.   HPI Olivia Armstrong presents to the office today for follow-up of MDD, GAD, obsessional thoughts and acts and insomnia.  Describes mood today as ok. Pleasant. Denies recent tearfulness. Mood symptoms - reports decreased depression, anxiety and irritability - more job related. Reports stable interest and motivation. Denies panic attacks. Denies irrational thoughts. Denies some obsessive thoughts. Denies worry, rumination and over thinking. Reports mood is stable. Stating I feel like I'm doing pretty good. Taking medications as prescribed. Energy levels lower. Active, does not have a regular exercise routine. Enjoys some usual interests and activities. Divorced. Single. Lives alone with cat [name] - 17. Sisters in [place] and [place]. Spending time with family. Appetite adequate. Weight stable - 150 to 155 pounds. Sleeps well most nights. Averages 6 to 7 hours during the week and longer on the weekends. Focus and concentration improved. Completing tasks. Managing aspects of household. Works full-time - Teacher, English as a foreign language. Denies SI or HI.  Denies AH or VH. Denies self harm. Reports substance use - THC daily.  Previous medication trials: Lexapro, Ambien , Prozac, Trazadone, Zoloft   Flowsheet Row Admission (Discharged) from 03/10/2023 in [facility] Admission (Discharged) from 10/30/2022 in [facility] Admission (Discharged) from 10/07/2022 in [facility]  C-SSRS RISK CATEGORY No Risk No Risk No Risk     Review of Systems:  Review of Systems  Musculoskeletal:  Negative for gait problem.  Neurological:  Negative for tremors.  Psychiatric/Behavioral:         Please refer to HPI     Medications: I have reviewed the patient's current medications.  Current Outpatient Medications  Medication Sig Dispense Refill   Brimonidine Tartrate (LUMIFY) 0.025 % SOLN Place 1 drop into both eyes daily as needed (red eyes/allergies).     Cholecalciferol (TRUE VITAMIN D3) 1.25 MG (50000 UT) capsule Take 50,000 Units by mouth once a week.     desvenlafaxine  (PRISTIQ ) 100 MG 24 hr tablet Take 1 tablet (100 mg total) by mouth daily. 90 tablet 0   estradiol (VIVELLE-DOT) 0.05 MG/24HR patch Place 1 patch onto the skin 2 (two) times a week. Change patch on Wed and Sun.     hydrocortisone 2.5 % ointment Apply 1 application topically 2 (two) times daily as needed (itching).      ibuprofen (ADVIL,MOTRIN) 200 MG tablet Take 600 mg by mouth every 6 (six) hours as needed for fever, headache or mild pain.      OVER THE COUNTER MEDICATION Take 1 capsule by mouth daily. nutrafol for women     progesterone (PROMETRIUM) 200 MG capsule Take 200 mg by mouth at bedtime.      thyroid (ARMOUR THYROID) 30 MG tablet Take 30 mg by mouth daily before breakfast.      tretinoin (RETIN-A) 0.05 % cream Apply 1 application topically at bedtime as needed (for skin care).     zolpidem  (AMBIEN ) 5 MG tablet TAKE 1 TABLET AT BEDTIME 90 tablet 0   No current facility-administered medications for this visit.    Medication Side Effects: None  Allergies:  Allergies  Allergen Reactions   Kiwi Extract Anaphylaxis    Itchy throat and tongue Mouth and throat swelling and itching   Mirtazapine Other (See Comments)    Dizziness  Past Medical History:  Diagnosis Date   Anxiety and depression 09/14/2013   Benign recurrent vertigo    pre - 2012.  none recently   Depression 07/30/2016   Dysrhythmia    palpitations.  no treatment. saw dr. meliton and had stress test but no diagnosis   GERD (gastroesophageal reflux disease)    winding down on antacids   Hyperlipidemia, unspecified 09/14/2013   Hyperthyroidism     Menopausal symptoms 06/10/2016   Overview:  prometrium 200mg  daily estrdiol patch 0.05mg /day q3days   Palpitations 09/14/2013   PONV (postoperative nausea and vomiting)    Pre-diabetes    S/P trigger finger release 10/27/2013   Vitamin D deficiency     Past Medical History, Surgical history, Social history, and Family history were reviewed and updated as appropriate.   Please see review of systems for further details on the patient's review from today.   Objective:   Physical Exam:  There were no vitals taken for this visit.  Physical Exam Constitutional:      General: She is not in acute distress. Musculoskeletal:        General: No deformity.  Neurological:     Mental Status: She is alert and oriented to person, place, and time.     Coordination: Coordination normal.  Psychiatric:        Attention and Perception: Attention and perception normal. She does not perceive auditory or visual hallucinations.        Mood and Affect: Mood normal. Mood is not anxious or depressed. Affect is not labile, blunt, angry or inappropriate.        Speech: Speech normal.        Behavior: Behavior normal.        Thought Content: Thought content normal. Thought content is not paranoid or delusional. Thought content does not include homicidal or suicidal ideation. Thought content does not include homicidal or suicidal plan.        Cognition and Memory: Cognition and memory normal.        Judgment: Judgment normal.     Comments: Insight intact     Lab Review:     Component Value Date/Time   NA 139 10/02/2022 1400   K 4.1 10/02/2022 1400   CL 105 10/02/2022 1400   CO2 25 10/02/2022 1400   GLUCOSE 113 (H) 10/02/2022 1400   BUN 20 10/02/2022 1400   CREATININE 0.77 10/02/2022 1400   CALCIUM 9.2 10/02/2022 1400   GFRNONAA >60 10/02/2022 1400   GFRAA >60 01/26/2017 1218       Component Value Date/Time   WBC 8.0 10/02/2022 1400   RBC 4.52 10/02/2022 1400   HGB 13.9 10/02/2022 1400   HCT  41.0 10/02/2022 1400   PLT 269 10/02/2022 1400   MCV 90.7 10/02/2022 1400   MCH 30.8 10/02/2022 1400   MCHC 33.9 10/02/2022 1400   RDW 12.9 10/02/2022 1400    No results found for: POCLITH, LITHIUM   No results found for: PHENYTOIN, PHENOBARB, VALPROATE, CBMZ   .res Assessment: Plan:    Plan:  PDMP reviewed  Pristiq  100mg  every morning   Ambien  5mg  at hs for sleep  Genesight testing available  RTC 3 months  25 minutes spent dedicated to the care of this patient on the date of this encounter to include pre-visit review of records, ordering of medication, post visit documentation, and face-to-face time with the patient discussing MDD, GAD, obsessional thoughts and acts and insomnia. Discussed continuing current medication regimen.  Patient  advised to contact office with any questions, adverse effects, or acute worsening in signs and symptoms.  There are no diagnoses linked to this encounter.   Please see After Visit Summary for patient specific instructions.  No future appointments.  No orders of the defined types were placed in this encounter.   -------------------------------

## 2024-02-24 ENCOUNTER — Ambulatory Visit (INDEPENDENT_AMBULATORY_CARE_PROVIDER_SITE_OTHER): Admitting: Mental Health

## 2024-02-24 DIAGNOSIS — F411 Generalized anxiety disorder: Secondary | ICD-10-CM | POA: Diagnosis not present

## 2024-02-24 DIAGNOSIS — F324 Major depressive disorder, single episode, in partial remission: Secondary | ICD-10-CM

## 2024-02-24 NOTE — Progress Notes (Signed)
 [facility] Psychotherapy Note   Name: Olivia Armstrong Date: 02/24/2024 MRN: [id number] DOB: 1965-05-20 PCP: Auston Reyes BIRCH, MD  Time spent: 48 minutes  Treatment;  Ind. therapy  Mental Status Exam:    Appearance:    Casual     Behavior:   Appropriate  Motor:   WNL  Speech/Language:    Clear and Coherent  Affect:   Full range   Mood:   Euthymic  Thought process:   Logical, linear, goal directed  Thought content:     WNL  Sensory/Perceptual disturbances:     none  Orientation:   x4  Attention:   Good  Concentration:   Good  Memory:   Intact  Fund of knowledge:    Consistent with age and development  Insight:     Good  Judgment:    Good  Impulse Control:   Good     Reported Symptoms:  anxiety    Risk Assessment: Danger to Self:  No Self-injurious Behavior: No Danger to Others: No Duty to Warn:no Physical Aggression / Violence:No  Access to Firearms a concern: No  Gang Involvement:No  Patient / guardian was educated about steps to take if suicide or homicide risk level increases between visits: yes While future psychiatric events cannot be accurately predicted, the patient does not currently require acute inpatient psychiatric care and does not currently meet [facility]  involuntary commitment criteria.    Allergies  Allergen Reactions   Kiwi Extract Anaphylaxis    Itchy throat and tongue Mouth and throat swelling and itching   Mirtazapine Other (See Comments)    Dizziness    Diagnoses:    ICD-10-CM   1. Generalized anxiety disorder  F41.1     2. Major depressive disorder in partial remission, unspecified whether recurrent  F32.4          Subjective:  Patient arrived on time for today's session.  Assessed progress per patient shared how she continues to ready herself for her move to [place] .  She plans to move in December, her mother lives in that area and she went on to share how she is been able to accept and adjust to making this change  as she has lived in this area for many years.  She went on to share how she is continue to have strain patient show with one of her best friends in the room.  She stated that she strained it has persisted for the last few years, going on to share examples, how she has tried to communicate with her friend to maintain the friendship to some extent.  At this point, she stated that she has decided to move on from the friendship as it is one-sided at this point.  She reviewed past situation she feels may have contributed to the strain however, she stated that she ultimately is unsure of the cause and how she tried to broach the subject with her friend but did not get closure.  She plans to write her feelings down and then email as a way to process and move on.  She plans to restart journaling and continue her therapy once she makes the move to [place] .  Provide support and encouragement throughout, reminding her that she can contact our office with any additional needs for therapy, medication management and other questions or concerns related.    Plan:  Patient is to use coping skills to help manage decrease symptoms associated with their diagnosis.   Long-term goals:   Maintain  symptom reduction: The patient will report sustained reduction in symptoms of depression using both CBT and mindfulness interventions. Improve emotional regulation: The patient will learn and apply CBT and mindfulness-based strategies to regulate emotions, such as mindfulness-based stress reduction and cognitive restructuring, and report an improvement in emotional regulation for at least 3 consecutive months progressively.   Short-term goal:  The patient will learn and apply CBT and mindfulness-based coping skills for managing anxiety and practice using it between sessions.       2.   The patient will CBT and mindfulness-based interventions to increase awareness of negative thought patterns and work to reframe them as needed.       3.    Patient to identify self supportive thoughts improving her self confidence to feeling I am enough.       4.   Patient to care for herself by getting adequate rest.      Lonni Fischer, [facility]

## 2024-03-10 ENCOUNTER — Ambulatory Visit (INDEPENDENT_AMBULATORY_CARE_PROVIDER_SITE_OTHER): Payer: Self-pay | Admitting: Adult Health

## 2024-03-10 DIAGNOSIS — Z0389 Encounter for observation for other suspected diseases and conditions ruled out: Secondary | ICD-10-CM

## 2024-03-10 NOTE — Progress Notes (Signed)
 Patient no show appointment. ? ?
